# Patient Record
Sex: Female | Born: 1985 | Race: Black or African American | Hispanic: No | Marital: Married | Smoking: Never smoker
Health system: Southern US, Community
[De-identification: ages and names within clinical notes are randomized; demographics above are authoritative.]

## PROBLEM LIST (undated history)

## (undated) ENCOUNTER — Inpatient Hospital Stay (HOSPITAL_COMMUNITY): Payer: Self-pay

## (undated) DIAGNOSIS — L910 Hypertrophic scar: Secondary | ICD-10-CM

## (undated) DIAGNOSIS — R519 Headache, unspecified: Secondary | ICD-10-CM

## (undated) DIAGNOSIS — R51 Headache: Secondary | ICD-10-CM

## (undated) DIAGNOSIS — N883 Incompetence of cervix uteri: Secondary | ICD-10-CM

## (undated) HISTORY — PX: WISDOM TOOTH EXTRACTION: SHX21

---

## 2008-06-26 ENCOUNTER — Emergency Department (HOSPITAL_COMMUNITY): Admission: EM | Admit: 2008-06-26 | Discharge: 2008-06-26 | Payer: Self-pay | Admitting: Emergency Medicine

## 2013-02-16 ENCOUNTER — Ambulatory Visit (INDEPENDENT_AMBULATORY_CARE_PROVIDER_SITE_OTHER): Admitting: Family Medicine

## 2013-02-16 VITALS — BP 112/80 | HR 80 | Temp 98.2°F | Resp 16 | Ht 68.0 in | Wt 179.0 lb

## 2013-02-16 DIAGNOSIS — Z8049 Family history of malignant neoplasm of other genital organs: Secondary | ICD-10-CM

## 2013-02-16 DIAGNOSIS — M25559 Pain in unspecified hip: Secondary | ICD-10-CM

## 2013-02-16 DIAGNOSIS — N898 Other specified noninflammatory disorders of vagina: Secondary | ICD-10-CM

## 2013-02-16 DIAGNOSIS — N939 Abnormal uterine and vaginal bleeding, unspecified: Secondary | ICD-10-CM

## 2013-02-16 LAB — POCT UA - MICROSCOPIC ONLY
Bacteria, U Microscopic: NEGATIVE
Casts, Ur, LPF, POC: NEGATIVE
Crystals, Ur, HPF, POC: NEGATIVE
Mucus, UA: NEGATIVE
Yeast, UA: NEGATIVE

## 2013-02-16 LAB — POCT URINALYSIS DIPSTICK
Bilirubin, UA: NEGATIVE
Glucose, UA: NEGATIVE
Ketones, UA: NEGATIVE
Leukocytes, UA: NEGATIVE
Nitrite, UA: NEGATIVE
Protein, UA: NEGATIVE
Spec Grav, UA: 1.015
Urobilinogen, UA: 0.2
pH, UA: 7

## 2013-02-16 LAB — POCT CBC
Granulocyte percent: 43.5 % (ref 37–80)
HCT, POC: 42.1 % (ref 37.7–47.9)
Hemoglobin: 13.2 g/dL (ref 12.2–16.2)
Lymph, poc: 3.2 (ref 0.6–3.4)
MCH, POC: 27 pg (ref 27–31.2)
MCHC: 31.4 g/dL — AB (ref 31.8–35.4)
MCV: 56.2 fL — AB (ref 80–97)
MID (cbc): 0.9 (ref 0–0.9)
MPV: 12.2 fL (ref 0–99.8)
POC Granulocyte: 3.2 (ref 2–6.9)
POC LYMPH PERCENT: 43.8 % (ref 10–50)
POC MID %: 12.7 %M — AB (ref 0–12)
Platelet Count, POC: 199 10*3/uL (ref 142–424)
RBC: 4.88 M/uL (ref 4.04–5.48)
RDW, POC: 14.7 %
WBC: 7.4 10*3/uL (ref 4.6–10.2)

## 2013-02-16 NOTE — Progress Notes (Signed)
[facility]:  Office Visit  Chief Complaint:  Chief Complaint  Patient presents with  . Menstrual Problem    irregular bleeding, concerned due to family history of uterine growths, cervical cancer    HPI: Kelsey Willis is a 27 y.o. female who complains of irregular vaginal bleeding for less than 24 hours. Not heavy but feels like another period, no clots.  LMP 02/05/13. She has been on the same OCP for many years now and it has never caused her to have breakthrough bleeding. Been on her current birth control for years and takes them as rx Menarche age 39, periods last about 5 days, first 2 days heavy and then light, no clots,minimal cramping Does not have any h/o STDs, gets tested every drill period No other vaginal sxs ie  dc, itching, burning No prior abnormal paps, last pap in January. History of family ovarian cysts/cancer  and cervical cancer at around 27-28 for many females in her family --aunts, GM, mom Denies fevers,c hills, night seats, unintentional weightloss  History reviewed. No pertinent past medical history. History reviewed. No pertinent past surgical history. History   Social History  . Marital Status: Single    Spouse Name: N/A    Number of Children: N/A  . Years of Education: N/A   Social History Main Topics  . Smoking status: Never Smoker   . Smokeless tobacco: None  . Alcohol Use: No  . Drug Use: No  . Sexually Active: Yes    Birth Control/ Protection: Pill   Other Topics Concern  . None   Social History Narrative  . None   Family History  Problem Relation Age of Onset  . Cancer Mother   . Asthma Mother   . Heart disease Father   . Cancer Maternal Grandmother   . Cancer Paternal Grandmother   . Diabetes Paternal Grandfather   . Heart disease Paternal Grandfather    No Known Allergies Prior to Admission medications   Medication Sig Start Date End Date Taking? Authorizing Provider  lenalidomide (REVLIMID) 15 MG  capsule Take 15 mg by mouth daily.   Yes Historical Provider, MD     ROS: The patient denies fevers, chills, night sweats, unintentional weight loss, chest pain, palpitations, wheezing, dyspnea on exertion, nausea, vomiting, abdominal pain, dysuria, hematuria, melena, numbness, weakness, or tingling.   All other systems have been reviewed and were otherwise negative with the exception of those mentioned in the HPI and as above.    PHYSICAL EXAM: Filed Vitals:   02/16/13 1713  BP: 112/80  Pulse: 80  Temp: 98.2 F (36.8 C)  Resp: 16   Filed Vitals:   02/16/13 1713  Height: 5\' 8"  (1.727 m)  Weight: 179 lb (81.194 kg)   Body mass index is 27.22 kg/(m^2).  General: Alert, no acute distress HEENT:  Normocephalic, atraumatic, oropharynx patent.  Cardiovascular:  Regular rate and rhythm, no rubs murmurs or gallops.  No Carotid bruits, radial pulse intact. No pedal edema.  Respiratory: Clear to auscultation bilaterally.  No wheezes, rales, or rhonchi.  No cyanosis, no use of accessory musculature GI: No organomegaly, abdomen is soft and non-tender, positive bowel sounds.  No masses. Skin: No rashes. Neurologic: Facial musculature symmetric. Psychiatric: Patient is appropriate throughout our interaction. Lymphatic: No cervical lymphadenopathy Musculoskeletal: Gait intact. No inguinal LAD GU normal except for vaginal bleeding. No masses, lesions. No CMT,no rashes. No vaginal dc except for blood, no clots.  Mild tenderness pelvic right worse than  left   LABS: Results for orders placed in visit on 02/16/13  GC/CHLAMYDIA PROBE AMP      Result Value Range   CT Probe RNA NEGATIVE     GC Probe RNA NEGATIVE    POCT CBC      Result Value Range   WBC 7.4  4.6 - 10.2 K/uL   Lymph, poc 3.2  0.6 - 3.4   POC LYMPH PERCENT 43.8  10 - 50 %L   MID (cbc) 0.9  0 - 0.9   POC MID % 12.7 (*) 0 - 12 %M   POC Granulocyte 3.2  2 - 6.9   Granulocyte percent 43.5  37 - 80 %G   RBC 4.88  4.04 - 5.48  M/uL   Hemoglobin 13.2  12.2 - 16.2 g/dL   HCT, POC 16.1  09.6 - 47.9 %   MCV 56.2 (*) 80 - 97 fL   MCH, POC 27.0  27 - 31.2 pg   MCHC 31.4 (*) 31.8 - 35.4 g/dL   RDW, POC 04.5     Platelet Count, POC 199  142 - 424 K/uL   MPV 12.2  0 - 99.8 fL  POCT URINALYSIS DIPSTICK      Result Value Range   Color, UA yellow     Clarity, UA clear     Glucose, UA neg     Bilirubin, UA neg     Ketones, UA neg     Spec Grav, UA 1.015     Blood, UA moderate     pH, UA 7.0     Protein, UA neg     Urobilinogen, UA 0.2     Nitrite, UA neg     Leukocytes, UA Negative    POCT UA - MICROSCOPIC ONLY      Result Value Range   WBC, Ur, HPF, POC 1-5     RBC, urine, microscopic 0-2     Bacteria, U Microscopic neg     Mucus, UA neg     Epithelial cells, urine per micros 0-2     Crystals, Ur, HPF, POC neg     Casts, Ur, LPF, POC neg     Yeast, UA neg       EKG/XRAY:   Primary read interpreted by Dr. Conley Rolls at [facility].   ASSESSMENT/PLAN: Encounter Diagnoses  Name Primary?  . Vaginal bleeding Yes  . Pain in joint, pelvic region and thigh, unspecified laterality   . Family history of cervical cancer    Possibly related to break through bleeding from OCP. However, she is very anxious about family h.o ovarian/cervical cysts and cancer in her family. She wants further assurance that nothing is wrong. GC pending Needs transvaginal US Needs to return for just labwork for CBC since MCV was totally abnormal in 1-2 weeks.  F/u prn   LE, THAO PHUONG, DO 02/17/2013 2:49 PM

## 2013-02-17 LAB — GC/CHLAMYDIA PROBE AMP
CT Probe RNA: NEGATIVE
GC Probe RNA: NEGATIVE

## 2013-02-18 ENCOUNTER — Other Ambulatory Visit: Payer: Self-pay | Admitting: Radiology

## 2013-02-18 DIAGNOSIS — R102 Pelvic and perineal pain: Secondary | ICD-10-CM

## 2013-02-26 ENCOUNTER — Other Ambulatory Visit: Payer: Self-pay

## 2013-11-11 ENCOUNTER — Encounter: Payer: Self-pay | Admitting: Obstetrics & Gynecology

## 2013-11-11 ENCOUNTER — Ambulatory Visit (INDEPENDENT_AMBULATORY_CARE_PROVIDER_SITE_OTHER): Admitting: Obstetrics

## 2013-11-11 VITALS — BP 133/85 | Temp 97.8°F | Wt 192.0 lb

## 2013-11-11 DIAGNOSIS — Z3201 Encounter for pregnancy test, result positive: Secondary | ICD-10-CM

## 2013-11-11 DIAGNOSIS — Z3400 Encounter for supervision of normal first pregnancy, unspecified trimester: Secondary | ICD-10-CM

## 2013-11-11 LAB — POCT URINALYSIS DIPSTICK
Bilirubin, UA: NEGATIVE
KETONES UA: NEGATIVE
Nitrite, UA: NEGATIVE
PH UA: 6
RBC UA: NEGATIVE
UROBILINOGEN UA: NEGATIVE

## 2013-11-11 NOTE — Progress Notes (Signed)
Pulse: 76 Subjective:    Kelsey Willis is being seen today for her first obstetrical visit.  This is not a planned pregnancy. She is at 6167w5d gestation. Her obstetrical history is significant for None. Relationship with FOB: significant other, living together. Patient does intend to breast feed. Pregnancy history fully reviewed.  Menstrual History: OB History   Grav Para Term Preterm Abortions TAB SAB Ect Mult Living   1               Last Pap: 2013 Results were normal. Menarche age: [age]  Patient's last menstrual period was 10/02/2013.    The following portions of the patient's history were reviewed and updated as appropriate: allergies, current medications, past family history, past medical history, past social history, past surgical history and problem list.  Review of Systems Pertinent items are noted in HPI.    Objective:    General appearance: alert and no distress Abdomen: normal findings: soft, non-tender Pelvic: cervix normal in appearance, external genitalia normal, no adnexal masses or tenderness, no cervical motion tenderness, rectovaginal septum normal, uterus normal size, shape, and consistency and vagina normal without discharge Extremities: extremities normal, atraumatic, no cyanosis or edema    Assessment:    Pregnancy at 3667w5d weeks    Plan:    Initial labs drawn. Prenatal vitamins.  Counseling provided regarding continued use of seat belts, cessation of alcohol consumption, smoking or use of illicit drugs; infection precautions i.e., influenza/TDAP immunizations, toxoplasmosis,CMV, parvovirus, listeria and varicella; workplace safety, exercise during pregnancy; routine dental care, safe medications, sexual activity, hot tubs, saunas, pools, travel, caffeine use, fish and methlymercury, potential toxins, hair treatments, varicose veins Weight gain recommendations per IOM guidelines reviewed: underweight/BMI< 18.5--> gain 28 - 40 lbs; normal weight/BMI 18.5 -  24.9--> gain 25 - 35 lbs; overweight/BMI 25 - 29.9--> gain 15 - 25 lbs; obese/BMI >30->gain  11 - 20 lbs Problem list reviewed and updated. FIRST/CF mutation testing/NIPT/QUAD SCREEN discussed: requested. Role of ultrasound in pregnancy discussed; fetal survey: requested. Amniocentesis discussed: not indicated. VBAC calculator score: VBAC consent form provided Follow up in 4 weeks. 50% of 20 min visit spent on counseling and coordination of care.

## 2013-11-12 ENCOUNTER — Encounter: Payer: Self-pay | Admitting: Obstetrics

## 2013-11-12 LAB — OBSTETRIC PANEL
ANTIBODY SCREEN: NEGATIVE
BASOS ABS: 0 10*3/uL (ref 0.0–0.1)
Basophils Relative: 1 % (ref 0–1)
EOS ABS: 0.1 10*3/uL (ref 0.0–0.7)
EOS PCT: 2 % (ref 0–5)
HEMATOCRIT: 41.4 % (ref 36.0–46.0)
Hemoglobin: 13.6 g/dL (ref 12.0–15.0)
Hepatitis B Surface Ag: NEGATIVE
LYMPHS PCT: 39 % (ref 12–46)
Lymphs Abs: 2.5 10*3/uL (ref 0.7–4.0)
MCH: 27.5 pg (ref 26.0–34.0)
MCHC: 32.9 g/dL (ref 30.0–36.0)
MCV: 83.8 fL (ref 78.0–100.0)
MONO ABS: 0.5 10*3/uL (ref 0.1–1.0)
Monocytes Relative: 8 % (ref 3–12)
Neutro Abs: 3.4 10*3/uL (ref 1.7–7.7)
Neutrophils Relative %: 50 % (ref 43–77)
PLATELETS: 335 10*3/uL (ref 150–400)
RBC: 4.94 MIL/uL (ref 3.87–5.11)
RDW: 13.7 % (ref 11.5–15.5)
RUBELLA: 15.4 {index} — AB (ref <0.90)
Rh Type: POSITIVE
WBC: 6.6 10*3/uL (ref 4.0–10.5)

## 2013-11-12 LAB — HEMOGLOBINOPATHY EVALUATION
HGB A: 98 % — AB (ref 96.8–97.8)
HGB S QUANTITAION: 0 %
Hemoglobin Other: 0 %
Hgb A2 Quant: 2 % — ABNORMAL LOW (ref 2.2–3.2)
Hgb F Quant: 0 % (ref 0.0–2.0)

## 2013-11-12 LAB — PAP IG, CT-NG, RFX HPV ASCU
Chlamydia Probe Amp: NEGATIVE
GC PROBE AMP: NEGATIVE

## 2013-11-12 LAB — VARICELLA ZOSTER ANTIBODY, IGG: Varicella IgG: 707.6 Index — ABNORMAL HIGH (ref <135.00)

## 2013-11-12 LAB — WET PREP BY MOLECULAR PROBE
Candida species: NEGATIVE
Gardnerella vaginalis: POSITIVE — AB
TRICHOMONAS VAG: NEGATIVE

## 2013-11-12 LAB — GC/CHLAMYDIA PROBE AMP
CT PROBE, AMP APTIMA: NEGATIVE
GC Probe RNA: NEGATIVE

## 2013-11-12 LAB — CULTURE, OB URINE
Colony Count: NO GROWTH
Organism ID, Bacteria: NO GROWTH

## 2013-11-12 LAB — HIV ANTIBODY (ROUTINE TESTING W REFLEX): HIV: NONREACTIVE

## 2013-11-12 LAB — VITAMIN D 25 HYDROXY (VIT D DEFICIENCY, FRACTURES): Vit D, 25-Hydroxy: 28 ng/mL — ABNORMAL LOW (ref 30–89)

## 2013-12-08 ENCOUNTER — Other Ambulatory Visit: Payer: Self-pay | Admitting: *Deleted

## 2013-12-08 DIAGNOSIS — N760 Acute vaginitis: Principal | ICD-10-CM

## 2013-12-08 DIAGNOSIS — B9689 Other specified bacterial agents as the cause of diseases classified elsewhere: Secondary | ICD-10-CM

## 2013-12-08 MED ORDER — METRONIDAZOLE 500 MG PO TABS: 500.0000 mg | ORAL_TABLET | Freq: Two times a day (BID) | ORAL | Status: DC

## 2013-12-09 ENCOUNTER — Encounter: Admitting: Obstetrics

## 2013-12-17 ENCOUNTER — Ambulatory Visit (INDEPENDENT_AMBULATORY_CARE_PROVIDER_SITE_OTHER): Admitting: Obstetrics

## 2013-12-17 ENCOUNTER — Encounter: Payer: Self-pay | Admitting: Obstetrics

## 2013-12-17 VITALS — BP 132/83 | Temp 98.5°F | Wt 191.0 lb

## 2013-12-17 DIAGNOSIS — Z3400 Encounter for supervision of normal first pregnancy, unspecified trimester: Secondary | ICD-10-CM

## 2013-12-17 LAB — POCT URINALYSIS DIPSTICK
Bilirubin, UA: NEGATIVE
Blood, UA: NEGATIVE
Glucose, UA: NEGATIVE
KETONES UA: NEGATIVE
Leukocytes, UA: NEGATIVE
NITRITE UA: NEGATIVE
Protein, UA: NEGATIVE
Spec Grav, UA: 1.02
Urobilinogen, UA: NEGATIVE
pH, UA: 5

## 2013-12-17 NOTE — Progress Notes (Signed)
P 77 Patient would like to discuss her Quad screen results.

## 2014-01-21 ENCOUNTER — Encounter: Payer: Self-pay | Admitting: *Deleted

## 2014-01-21 ENCOUNTER — Ambulatory Visit (INDEPENDENT_AMBULATORY_CARE_PROVIDER_SITE_OTHER): Admitting: Obstetrics

## 2014-01-21 ENCOUNTER — Encounter: Payer: Self-pay | Admitting: Obstetrics

## 2014-01-21 VITALS — BP 134/81 | Temp 98.5°F | Wt 194.0 lb

## 2014-01-21 DIAGNOSIS — J309 Allergic rhinitis, unspecified: Secondary | ICD-10-CM

## 2014-01-21 DIAGNOSIS — Z3400 Encounter for supervision of normal first pregnancy, unspecified trimester: Secondary | ICD-10-CM

## 2014-01-21 DIAGNOSIS — J302 Other seasonal allergic rhinitis: Secondary | ICD-10-CM

## 2014-01-21 LAB — POCT URINALYSIS DIPSTICK
BILIRUBIN UA: NEGATIVE
Blood, UA: NEGATIVE
GLUCOSE UA: NEGATIVE
Ketones, UA: NEGATIVE
LEUKOCYTES UA: NEGATIVE
Nitrite, UA: NEGATIVE
PH UA: 8
Protein, UA: NEGATIVE
Spec Grav, UA: 1.01
Urobilinogen, UA: NEGATIVE

## 2014-01-21 MED ORDER — LORATADINE 10 MG PO TABS: 10.0000 mg | ORAL_TABLET | Freq: Every day | ORAL | Status: DC

## 2014-01-21 NOTE — Progress Notes (Signed)
Pulse 91 Pt states that she is having head cold symptoms.  Pt isn't sure if it is allergies or cold.  Pt states that she has been feeling this way for past month.  Pt has not tried OTC medication. Pt states that she has been having some lower pelvic cramps on left side for the past couple weeks.

## 2014-01-22 LAB — AFP, QUAD SCREEN
AFP: 61.7 IU/mL
Curr Gest Age: 15.6 wks.days
Down Syndrome Scr Risk Est: <1:38500 {titer}
HCG, Total: 26913 m[IU]/mL
INH: 142.6 pg/mL
Interpretation-AFP: NEGATIVE
MOM FOR AFP: 2.17
MOM FOR HCG: 1.18
MoM for INH: 0.84
Open Spina bifida: NEGATIVE
Osb Risk: 1:994 {titer}
TRI 18 SCR RISK EST: NEGATIVE
Trisomy 18 (Edward) Syndrome Interp.: 1:143000 {titer}
UE3 VALUE: 0.4 ng/mL
uE3 Mom: 1.04

## 2014-01-27 ENCOUNTER — Other Ambulatory Visit: Payer: Self-pay | Admitting: Obstetrics

## 2014-01-27 ENCOUNTER — Encounter: Payer: Self-pay | Admitting: Obstetrics

## 2014-01-27 ENCOUNTER — Ambulatory Visit (INDEPENDENT_AMBULATORY_CARE_PROVIDER_SITE_OTHER)

## 2014-01-27 DIAGNOSIS — Q6602 Congenital talipes equinovarus, left foot: Principal | ICD-10-CM

## 2014-01-27 DIAGNOSIS — Q6689 Other  specified congenital deformities of feet: Secondary | ICD-10-CM

## 2014-01-27 DIAGNOSIS — Z1389 Encounter for screening for other disorder: Secondary | ICD-10-CM

## 2014-01-27 DIAGNOSIS — Z3400 Encounter for supervision of normal first pregnancy, unspecified trimester: Secondary | ICD-10-CM

## 2014-01-27 DIAGNOSIS — Q6601 Congenital talipes equinovarus, right foot: Secondary | ICD-10-CM

## 2014-01-27 LAB — US OB DETAIL + 14 WK

## 2014-01-29 ENCOUNTER — Other Ambulatory Visit: Payer: Self-pay | Admitting: Obstetrics

## 2014-01-29 DIAGNOSIS — Z3689 Encounter for other specified antenatal screening: Secondary | ICD-10-CM

## 2014-01-29 DIAGNOSIS — O283 Abnormal ultrasonic finding on antenatal screening of mother: Secondary | ICD-10-CM

## 2014-02-04 ENCOUNTER — Other Ambulatory Visit: Payer: Self-pay | Admitting: Obstetrics

## 2014-02-04 ENCOUNTER — Ambulatory Visit (HOSPITAL_COMMUNITY)
Admission: RE | Admit: 2014-02-04 | Discharge: 2014-02-04 | Disposition: A | Discharge status: Home / Self Care | Source: Ambulatory Visit | Attending: Obstetrics | Admitting: Obstetrics

## 2014-02-04 VITALS — BP 139/90 | HR 95 | Wt 205.0 lb

## 2014-02-04 DIAGNOSIS — O358XX0 Maternal care for other (suspected) fetal abnormality and damage, not applicable or unspecified: Secondary | ICD-10-CM | POA: Insufficient documentation

## 2014-02-04 DIAGNOSIS — Z363 Encounter for antenatal screening for malformations: Secondary | ICD-10-CM | POA: Insufficient documentation

## 2014-02-04 DIAGNOSIS — O283 Abnormal ultrasonic finding on antenatal screening of mother: Secondary | ICD-10-CM

## 2014-02-04 DIAGNOSIS — Z3689 Encounter for other specified antenatal screening: Secondary | ICD-10-CM

## 2014-02-04 DIAGNOSIS — Z1389 Encounter for screening for other disorder: Secondary | ICD-10-CM | POA: Insufficient documentation

## 2014-02-18 ENCOUNTER — Ambulatory Visit (INDEPENDENT_AMBULATORY_CARE_PROVIDER_SITE_OTHER): Admitting: Obstetrics

## 2014-02-18 VITALS — BP 120/75 | HR 80 | Temp 97.2°F | Wt 203.0 lb

## 2014-02-18 DIAGNOSIS — Z3400 Encounter for supervision of normal first pregnancy, unspecified trimester: Secondary | ICD-10-CM

## 2014-02-18 LAB — POCT URINALYSIS DIPSTICK
Bilirubin, UA: NEGATIVE
GLUCOSE UA: NEGATIVE
Ketones, UA: NEGATIVE
Leukocytes, UA: NEGATIVE
Nitrite, UA: NEGATIVE
RBC UA: NEGATIVE
Spec Grav, UA: <=1.005
UROBILINOGEN UA: NEGATIVE
pH, UA: 8

## 2014-02-19 ENCOUNTER — Encounter: Payer: Self-pay | Admitting: Obstetrics

## 2014-02-19 NOTE — Progress Notes (Signed)
Subjective:    Kelsey LevyChristina L Willis is a 28 y.o. female being seen today for her obstetrical visit. She is at 7915w0d gestation. Patient reports: no complaints . Fetal movement: normal.  Past Medical History  Diagnosis Date  . Medical history non-contributory     Past Surgical History  Procedure Laterality Date  . Wisdom tooth extraction      Current outpatient prescriptions:loratadine (CLARITIN) 10 MG tablet, Take 1 tablet (10 mg total) by mouth daily., Disp: 30 tablet, Rfl: 11;  prenatal vitamin w/FE, FA (PRENATAL 1 + 1) 27-1 MG TABS tablet, Take 1 tablet by mouth daily at 12 noon., Disp: , Rfl:  No Known Allergies  History  Substance Use Topics  . Smoking status: Never Smoker   . Smokeless tobacco: Never Used  . Alcohol Use: No    Family History  Problem Relation Age of Onset  . Cancer Mother   . Asthma Mother   . Heart disease Father   . Cancer Maternal Grandmother   . Cancer Paternal Grandmother   . Diabetes Paternal Grandfather   . Heart disease Paternal Grandfather      Review of Systems Constitutional: negative for anorexia Gastrointestinal: negative for abdominal pain Genitourinary:negative for vaginal discharge Musculoskeletal:negative for back pain Behavioral/Psych: negative for depression and tobacco use  Objective:    BP 120/75  Pulse 80  Temp(Src) 97.2 F (36.2 C)  Wt 203 lb (92.08 kg)  LMP 10/02/2013 FHT: 150 BPM  Uterine Size: size equals dates     Assessment:    Pregnancy @ 9515w0d    Plan:    OBGCT: ordered for next visit.  Labs, problem list reviewed and updated 2 hr GTT planned Follow up in 4 weeks.

## 2014-03-04 ENCOUNTER — Other Ambulatory Visit (HOSPITAL_COMMUNITY): Payer: Self-pay | Admitting: Maternal and Fetal Medicine

## 2014-03-04 ENCOUNTER — Ambulatory Visit (HOSPITAL_COMMUNITY)
Admission: RE | Admit: 2014-03-04 | Discharge: 2014-03-04 | Disposition: A | Discharge status: Home / Self Care | Source: Ambulatory Visit | Attending: Obstetrics | Admitting: Obstetrics

## 2014-03-04 DIAGNOSIS — O283 Abnormal ultrasonic finding on antenatal screening of mother: Secondary | ICD-10-CM

## 2014-03-04 DIAGNOSIS — O358XX0 Maternal care for other (suspected) fetal abnormality and damage, not applicable or unspecified: Secondary | ICD-10-CM | POA: Insufficient documentation

## 2014-03-04 DIAGNOSIS — O289 Unspecified abnormal findings on antenatal screening of mother: Secondary | ICD-10-CM | POA: Insufficient documentation

## 2014-03-04 DIAGNOSIS — Z3689 Encounter for other specified antenatal screening: Secondary | ICD-10-CM | POA: Insufficient documentation

## 2014-03-04 IMAGING — US US MFM OB TRANSVAGINAL
1 series · 12 of 28 positions shown · non-contrast
Comparison: none

[Series 1: us mfm ob transvaginal · 0.22mm/px · 12 of 55 slices shown]
[im 3/55]
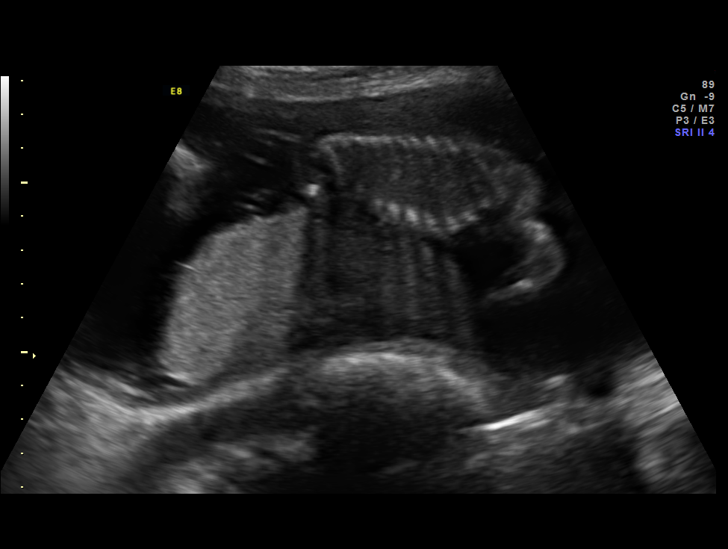
[im 7/55]
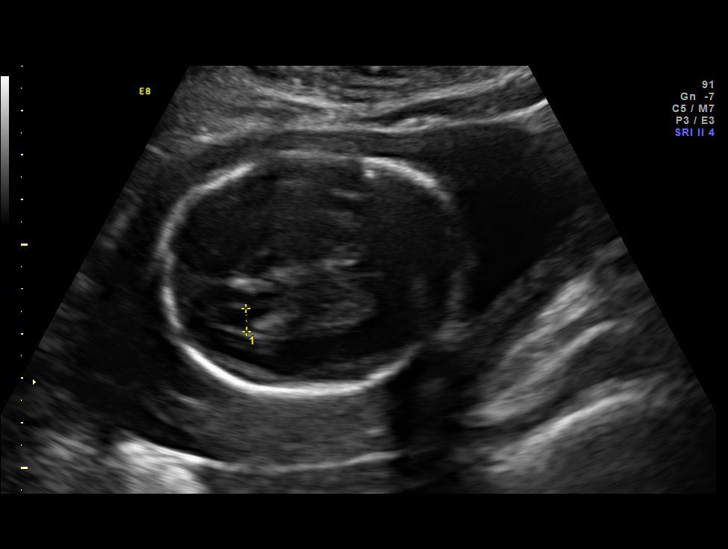
[im 11/55]
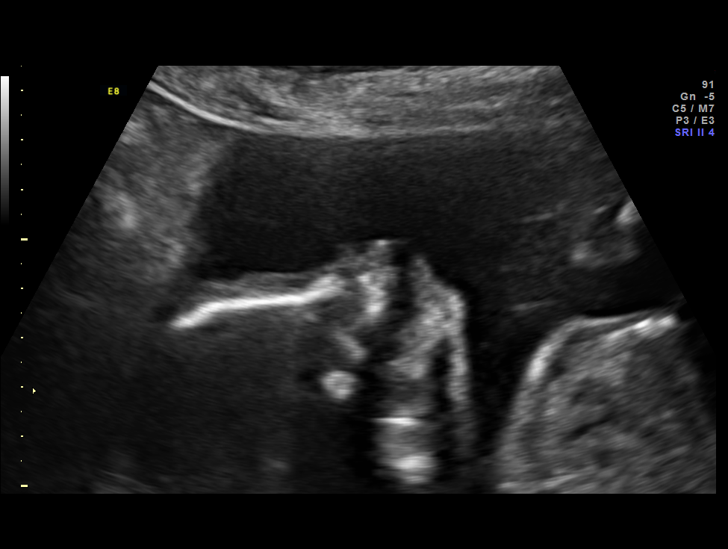
[im 17/55]
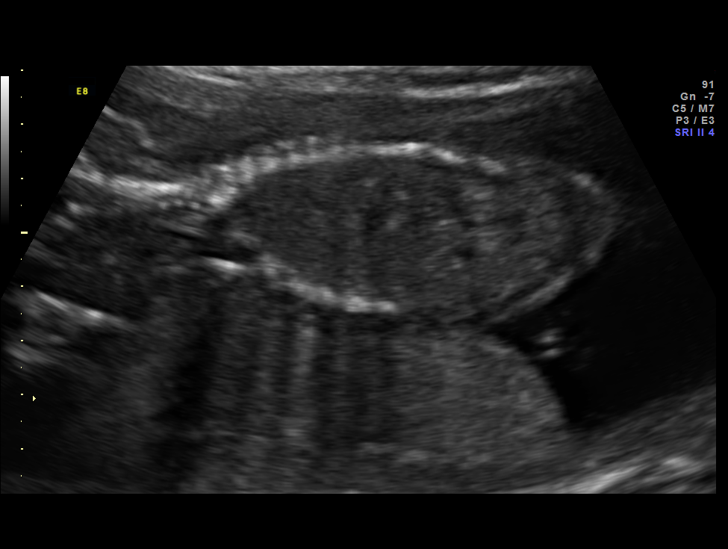
[im 21/55]
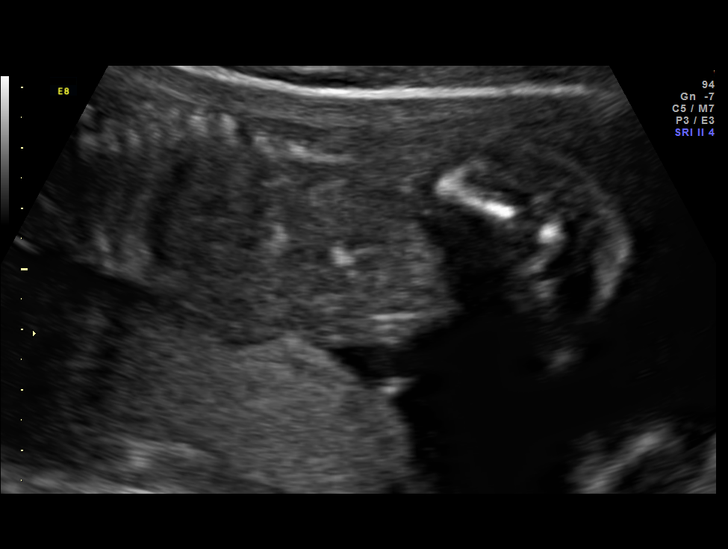
[im 25/55]
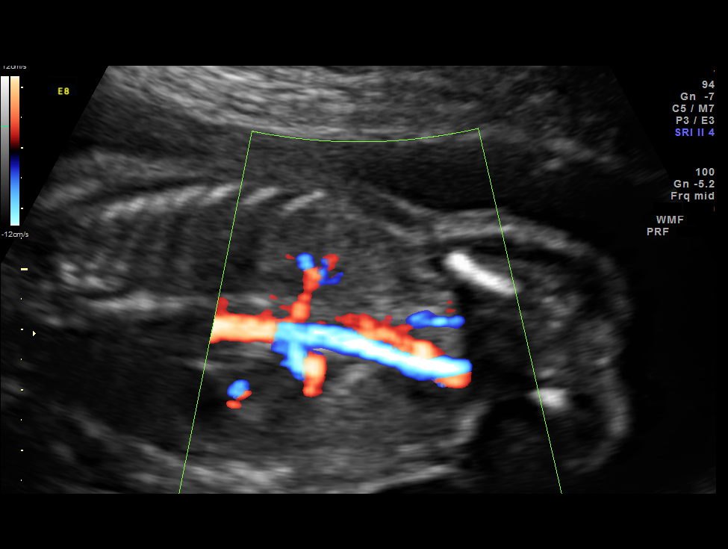
[im 31/55]
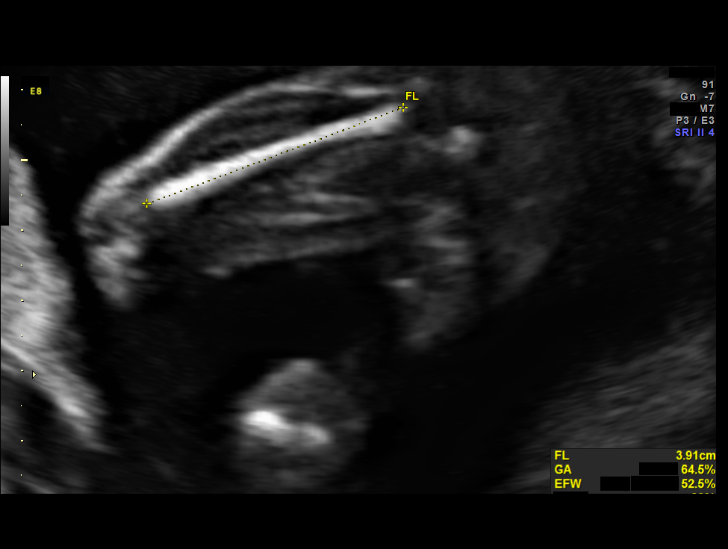
[im 35/55]
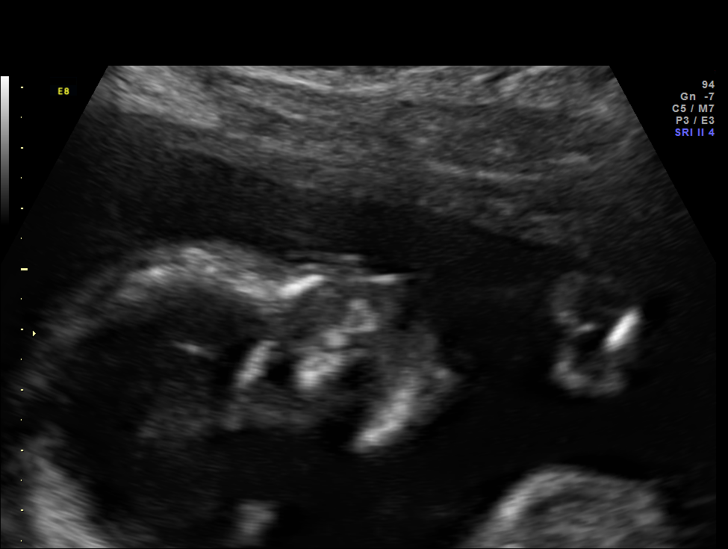
[im 39/55]
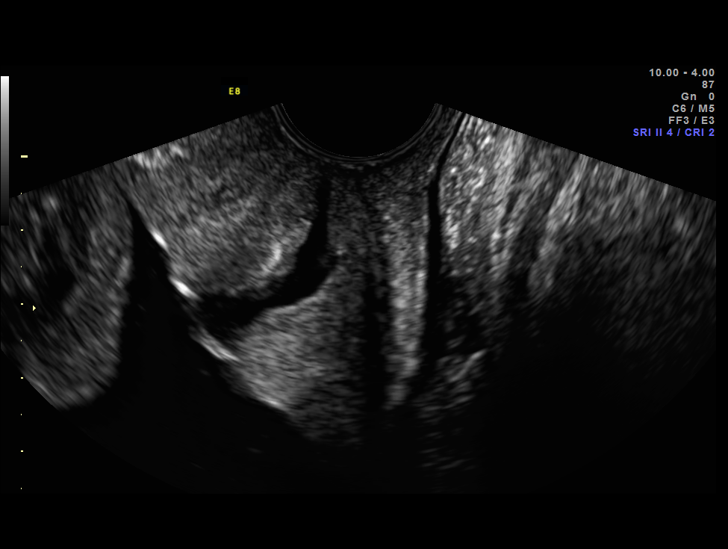
[im 45/55]
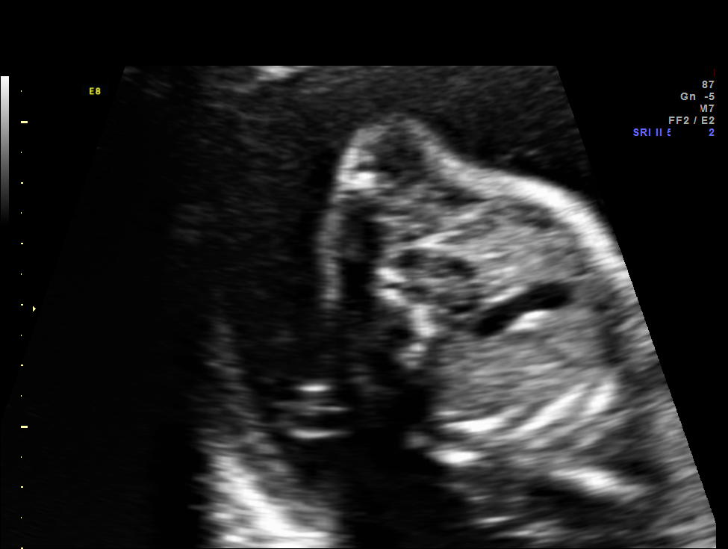
[im 49/55]
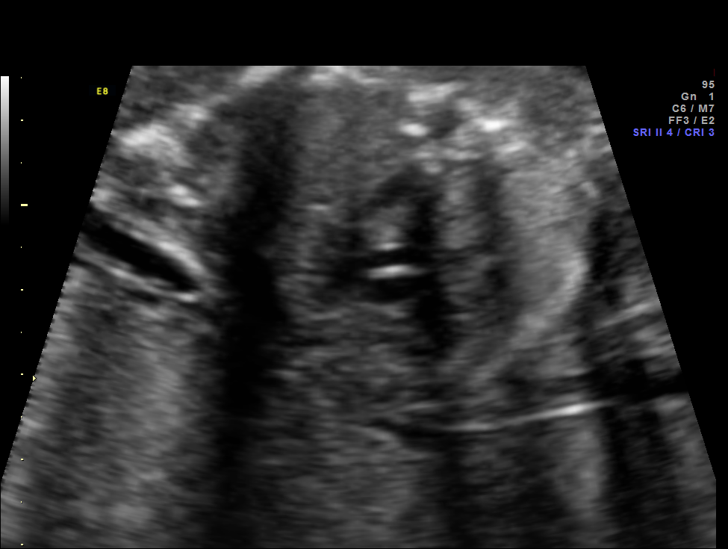
[im 53/55]
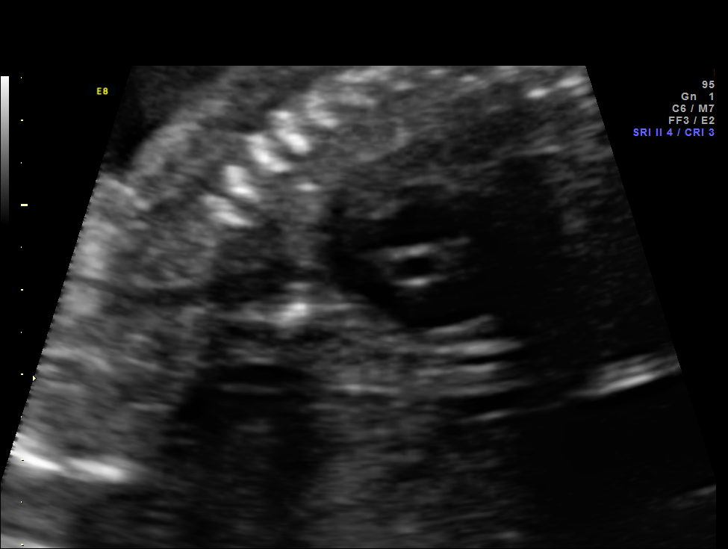

[12 of 28 positions shown; findings below may reference images not displayed]

OBSTETRICS REPORT
                      (Signed Final [DATE] [DATE])

Service(s) Provided

 US OB FOLLOW UP                                       76816.1
 US MFM OB TRANSVAGINAL                                76817.2
Indications

 Fetal abnormality - clubbed feet
Fetal Evaluation

 Num Of Fetuses:    1
 Fetal Heart Rate:  136                          bpm
 Cardiac Activity:  Observed
 Presentation:      Breech
 Placenta:          Posterior, above cervical
                    os
 P. Cord            Previously Visualized
 Insertion:

 Amniotic Fluid
 AFI FV:      Subjectively within normal limits
                                             Larg Pckt:     5.8  cm
Biometry

 BPD:     50.9  mm     G. Age:  21w 3d                CI:         72.4   70 - 86
 OFD:     70.3  mm                                    FL/HC:      20.0   18.4 -

 HC:     198.3  mm     G. Age:  22w 0d       45  %    HC/AC:      1.15   1.06 -

 AC:     172.7  mm     G. Age:  22w 1d       54  %    FL/BPD:     77.8   71 - 87
 FL:      39.6  mm     G. Age:  22w 5d       70  %    FL/AC:      22.9   20 - 24
 HUM:     36.3  mm     G. Age:  22w 5d       68  %

 Est. FW:     498  gm      1 lb 2 oz     55  %
Gestational Age

 LMP:           21w 6d        Date:  [DATE]                 EDD:   [DATE]
 U/S Today:     22w 0d                                        EDD:   [DATE]
 Best:          21w 6d     Det. By:  LMP  ([DATE])          EDD:   [DATE]
Anatomy

 Cranium:          Previously seen        Aortic Arch:      Appears normal
 Fetal Cavum:      Previously seen        Ductal Arch:      Previously seen
 Ventricles:       Appears normal         Diaphragm:        Appears normal
 Choroid Plexus:   Previously seen        Stomach:          Appears normal, left
                                                            sided
 Cerebellum:       Previously seen        Abdomen:          Appears normal
 Posterior Fossa:  Previously seen        Abdominal Wall:   Appears nml (cord
                                                            insert, abd wall)
 Nuchal Fold:      Previously seen        Cord Vessels:     Previously seen
 Face:             Orbits and profile     Kidneys:          Appear normal
                   previously seen
 Lips:             Previously seen        Bladder:          Appears normal
 Heart:            Previously seen        Spine:            Previously seen
 RVOT:             Appears normal         Lower             Clubfeet
                                          Extremities:
 LVOT:             Not well visualized    Upper             Previously seen
                                          Extremities:

 Other:  Male gender.
Cervix Uterus Adnexa

 Cervical Length:    3.3      cm

 Cervix:       Measured transvaginally.
Impression

 SIUP at 21+6 weeks
 Bilateral club feet (difficult to visualize today due to fetal
 position
 All other interval fetal anatomy was seen and appeared
 normal; limited views of LVOT
 Normal amniotic fluid volume
 Appropriate interval growth with EFW at the 55th %tile
 EV views of cervix: normal length (hypoechoic fluid/mucous
 fills the endocervical canal - similar in appearance to the
 images obtained during the first US)
 Amnion separation again seen

 We reviewed her quad screen - very low risks for T21, T18
 and ONTDs. I offered her a fetal ECHO and she accepted.
Recommendations

 Follow-up ultrasound for growth in 6 weeks
 Fetal ECHO scheduled

## 2014-03-08 ENCOUNTER — Other Ambulatory Visit: Payer: Self-pay | Admitting: Obstetrics

## 2014-03-08 DIAGNOSIS — O358XX0 Maternal care for other (suspected) fetal abnormality and damage, not applicable or unspecified: Secondary | ICD-10-CM

## 2014-03-18 ENCOUNTER — Encounter: Admitting: Obstetrics

## 2014-04-06 ENCOUNTER — Telehealth: Payer: Self-pay | Admitting: *Deleted

## 2014-04-06 NOTE — Telephone Encounter (Signed)
Patient is requesting a Rx for a breast pump to be faxed to the following facility. Patient delivered preterm out of town. 5:32 Rx faxed for breast pump. Patient notified.

## 2014-04-15 ENCOUNTER — Ambulatory Visit (HOSPITAL_COMMUNITY): Admission: RE | Admit: 2014-04-15 | Source: Ambulatory Visit

## 2014-07-26 ENCOUNTER — Telehealth: Payer: Self-pay | Admitting: *Deleted

## 2014-07-26 NOTE — Telephone Encounter (Signed)
Patient called and stated that she has a hard mass under her C section incision that is getting larger. 5:43 LM on VM to CB- she may come to the office on Wed @ 1:00

## 2014-08-30 ENCOUNTER — Encounter: Payer: Self-pay | Admitting: Obstetrics

## 2014-09-16 ENCOUNTER — Encounter (HOSPITAL_COMMUNITY): Payer: Self-pay | Admitting: *Deleted

## 2014-10-25 ENCOUNTER — Encounter: Payer: Self-pay | Admitting: *Deleted

## 2014-12-28 ENCOUNTER — Encounter: Payer: Self-pay | Admitting: Certified Nurse Midwife

## 2014-12-28 ENCOUNTER — Ambulatory Visit (INDEPENDENT_AMBULATORY_CARE_PROVIDER_SITE_OTHER): Admitting: Certified Nurse Midwife

## 2014-12-28 VITALS — BP 125/83 | HR 69 | Temp 97.7°F | Ht 67.0 in | Wt 213.0 lb

## 2014-12-28 DIAGNOSIS — Z01419 Encounter for gynecological examination (general) (routine) without abnormal findings: Secondary | ICD-10-CM

## 2014-12-28 DIAGNOSIS — Z30018 Encounter for initial prescription of other contraceptives: Secondary | ICD-10-CM

## 2014-12-28 LAB — RPR

## 2014-12-28 MED ORDER — ETONOGESTREL-ETHINYL ESTRADIOL 0.12-0.015 MG/24HR VA RING: 1.0000 | VAGINAL_RING | VAGINAL | Status: DC

## 2014-12-28 NOTE — Progress Notes (Signed)
Patient ID: Kelsey LevyChristina L Cortina, female   DOB: [birth year], 29 y.o.   MRN: [id number]    Subjective:     Kelsey Willis is a 29 y.o. female here for a routine exam.  Current complaints: desires annual exam and contraception.  In a stable relationship, about to get married.  [facility] [employer], had a premature infant at 24 weeks, doing well.  Fiance is about to Graduate, they are building a home.  Lots of stressors, denies depression/anxiety.      Personal health questionnaire:  Is patient Ashkenazi Jewish, have a family history of breast and/or ovarian cancer: no Is there a family history of uterine cancer diagnosed at age < 7550, gastrointestinal cancer, urinary tract cancer, family member who is a Personnel officerLynch syndrome-associated carrier: no Is the patient overweight and hypertensive, family history of diabetes, personal history of gestational diabetes, preeclampsia or PCOS: no Is patient over 5855, have PCOS,  family history of premature CHD under age 29, diabetes, smoke, have hypertension or peripheral artery disease:  no At any time, has a partner hit, kicked or otherwise hurt or frightened you?: no Over the past 2 weeks, have you felt down, depressed or hopeless?: no Over the past 2 weeks, have you felt little interest or pleasure in doing things?:no   Gynecologic History Patient's last menstrual period was 11/30/2014. Contraception: OCP (estrogen/progesterone) Last Pap: 11/11/2013. Results were: normal Last mammogram: none.   Obstetric History OB History  Gravida Para Term Preterm AB SAB TAB Ectopic Multiple Living  1             # Outcome Date GA Lbr Len/2nd Weight Sex Delivery Anes PTL Lv  1 Gravida               Past Medical History  Diagnosis Date  . Medical history non-contributory     Past Surgical History  Procedure Laterality Date  . Wisdom tooth extraction    . Cesarean section       Current outpatient prescriptions:  .  loratadine (CLARITIN) 10 MG tablet, Take 1  tablet (10 mg total) by mouth daily., Disp: 30 tablet, Rfl: 11 .  prenatal vitamin w/FE, FA (PRENATAL 1 + 1) 27-1 MG TABS tablet, Take 1 tablet by mouth daily at 12 noon., Disp: , Rfl:  .  etonogestrel-ethinyl estradiol (NUVARING) 0.12-0.015 MG/24HR vaginal ring, Place 1 each vaginally every 21 ( twenty-one) days. Insert one (1) ring vaginally and leave in place for three (3) weeks, then remove for one (1) week., Disp: 1 each, Rfl: 11 .  loratadine (CLARITIN) 5 MG/5ML syrup, Take by mouth., Disp: , Rfl:  No Known Allergies  History  Substance Use Topics  . Smoking status: Never Smoker   . Smokeless tobacco: Never Used  . Alcohol Use: No    Family History  Problem Relation Age of Onset  . Cancer Mother   . Asthma Mother   . Heart disease Father   . Cancer Maternal Grandmother   . Cancer Paternal Grandmother   . Diabetes Paternal Grandfather   . Heart disease Paternal Grandfather       Review of Systems  Constitutional: negative for fatigue and weight loss Respiratory: negative for cough and wheezing Cardiovascular: negative for chest pain, fatigue and palpitations Gastrointestinal: negative for abdominal pain and change in bowel habits Musculoskeletal:negative for myalgias Neurological: negative for gait problems and tremors Behavioral/Psych: negative for abusive relationship, depression Endocrine: negative for temperature intolerance   Genitourinary:negative for abnormal menstrual periods, genital  lesions, hot flashes, sexual problems and vaginal discharge Integument/breast: negative for breast lump, breast tenderness, nipple discharge and skin lesion(s)    Objective:       BP 125/83 mmHg  Pulse 69  Temp(Src) 97.7 F (36.5 C)  Ht  (1.702 m)  Wt 96.616 kg (213 lb)  BMI 33.35 kg/m2  LMP 11/30/2014  Breastfeeding? No General:   alert  Skin:   no rash or abnormalities  Lungs:   clear to auscultation bilaterally  Heart:   regular rate and rhythm, S1, S2 normal, no  murmur, click, rub or gallop  Breasts:   normal without suspicious masses, skin or nipple changes or axillary nodes  Abdomen:  normal findings: no organomegaly, soft, non-tender and no hernia  Pelvis:  External genitalia: normal general appearance Urinary system: urethral meatus normal and bladder without fullness, nontender Vaginal: normal without tenderness, induration or masses Cervix: normal appearance Adnexa: normal bimanual exam Uterus: anteverted and non-tender, normal size   Lab Review Urine pregnancy test Labs reviewed yes Radiologic studies reviewed no   Assessment:    Healthy female exam.  Contraception Counseling.    Plan:    Education reviewed: low fat, low cholesterol diet, safe sex/STD prevention, self breast exams and weight bearing exercise. Contraception: NuvaRing vaginal inserts. Follow up in: 1 year.   Meds ordered this encounter  Medications  . DISCONTD: etonogestrel-ethinyl estradiol (NUVARING) 0.12-0.015 MG/24HR vaginal ring    Sig: Place 1 each vaginally every 21 ( twenty-one) days. Insert one (1) ring vaginally and leave in place for three (3) weeks, then remove for one (1) week.    Dispense:  1 each    Refill:  11  . loratadine (CLARITIN) 5 MG/5ML syrup    Sig: Take by mouth.  . etonogestrel-ethinyl estradiol (NUVARING) 0.12-0.015 MG/24HR vaginal ring    Sig: Place 1 each vaginally every 21 ( twenty-one) days. Insert one (1) ring vaginally and leave in place for three (3) weeks, then remove for one (1) week.    Dispense:  1 each    Refill:  11   Orders Placed This Encounter  Procedures  . SureSwab, Vaginosis/Vaginitis Plus  . HIV antibody (with reflex)  . Hepatitis B surface antigen  . RPR  . Hepatitis C antibody   Possible management options include: Mirena IUD/Nexplanon

## 2014-12-29 LAB — PAP IG W/ RFLX HPV ASCU

## 2014-12-29 LAB — HIV ANTIBODY (ROUTINE TESTING W REFLEX): HIV: NONREACTIVE

## 2014-12-29 LAB — HEPATITIS C ANTIBODY: HCV Ab: NEGATIVE

## 2014-12-29 LAB — HEPATITIS B SURFACE ANTIGEN: Hepatitis B Surface Ag: NEGATIVE

## 2014-12-31 LAB — SURESWAB, VAGINOSIS/VAGINITIS PLUS
Atopobium vaginae: NOT DETECTED Log (cells/mL)
C. GLABRATA, DNA: DETECTED — AB
C. PARAPSILOSIS, DNA: NOT DETECTED
C. TROPICALIS, DNA: NOT DETECTED
C. albicans, DNA: NOT DETECTED
C. trachomatis RNA, TMA: NOT DETECTED
GARDNERELLA VAGINALIS: 7.9 Log (cells/mL)
LACTOBACILLUS SPECIES: NOT DETECTED Log (cells/mL)
MEGASPHAERA SPECIES: NOT DETECTED Log (cells/mL)
N. gonorrhoeae RNA, TMA: NOT DETECTED
T. VAGINALIS RNA, QL TMA: NOT DETECTED

## 2015-01-14 ENCOUNTER — Encounter: Payer: Self-pay | Admitting: *Deleted

## 2015-01-14 ENCOUNTER — Other Ambulatory Visit: Payer: Self-pay | Admitting: *Deleted

## 2015-01-14 DIAGNOSIS — B373 Candidiasis of vulva and vagina: Secondary | ICD-10-CM

## 2015-01-14 DIAGNOSIS — B3731 Acute candidiasis of vulva and vagina: Secondary | ICD-10-CM

## 2015-01-14 DIAGNOSIS — N760 Acute vaginitis: Principal | ICD-10-CM

## 2015-01-14 DIAGNOSIS — B9689 Other specified bacterial agents as the cause of diseases classified elsewhere: Secondary | ICD-10-CM

## 2015-01-14 MED ORDER — ITRACONAZOLE 200 MG PO TABS: 200.0000 | ORAL_TABLET | Freq: Every day | ORAL | Status: DC

## 2015-01-14 MED ORDER — METRONIDAZOLE 500 MG PO TABS: 500.0000 mg | ORAL_TABLET | Freq: Two times a day (BID) | ORAL | Status: DC

## 2015-01-18 ENCOUNTER — Telehealth: Payer: Self-pay | Admitting: *Deleted

## 2015-01-18 NOTE — Telephone Encounter (Signed)
Pharmacy called to office for verification on Rx of Itraconazole.  States 200 tablets by mouth daily.  Return call to pharmacy,  Rx correction for 200mg  by mouth daily.

## 2015-02-28 ENCOUNTER — Encounter (HOSPITAL_COMMUNITY): Payer: Self-pay | Admitting: Emergency Medicine

## 2015-02-28 ENCOUNTER — Emergency Department (HOSPITAL_COMMUNITY)
Admission: EM | Admit: 2015-02-28 | Discharge: 2015-02-28 | Disposition: A | Discharge status: Home / Self Care | Attending: Emergency Medicine | Admitting: Emergency Medicine

## 2015-02-28 DIAGNOSIS — Z792 Long term (current) use of antibiotics: Secondary | ICD-10-CM | POA: Diagnosis not present

## 2015-02-28 DIAGNOSIS — Z79899 Other long term (current) drug therapy: Secondary | ICD-10-CM | POA: Diagnosis not present

## 2015-02-28 DIAGNOSIS — R21 Rash and other nonspecific skin eruption: Secondary | ICD-10-CM | POA: Insufficient documentation

## 2015-02-28 MED ORDER — FAMOTIDINE 20 MG PO TABS: 20.0000 mg | ORAL_TABLET | Freq: Two times a day (BID) | ORAL | Status: DC

## 2015-02-28 MED ORDER — PREDNISONE 20 MG PO TABS
60.0000 mg | ORAL_TABLET | Freq: Once | ORAL | Status: AC
  Administered 2015-02-28: 60 mg via ORAL
  Filled 2015-02-28: qty 3

## 2015-02-28 MED ORDER — PREDNISONE 10 MG PO TABS: 40.0000 mg | ORAL_TABLET | Freq: Every day | ORAL | Status: DC

## 2015-02-28 MED ORDER — FAMOTIDINE 20 MG PO TABS
20.0000 mg | ORAL_TABLET | Freq: Once | ORAL | Status: AC
  Administered 2015-02-28: 20 mg via ORAL
  Filled 2015-02-28: qty 1

## 2015-02-28 NOTE — ED Notes (Signed)
Pt presents to ED with diffuse rash all over her body from the neck down.  It is visibly red and has raised bumps that are extremely itchy. She states that it is sore but she thinks that is due to constant rubbing and scratching.  She has taken Benadryl (5 doses in the last two days) and Cortisone 10 cream with no relief of the itching.  She denies any recent changes in soap, clothing, food, or outdoor habits.

## 2015-02-28 NOTE — ED Provider Notes (Signed)
CSN: [id number]     Arrival date & time 02/28/15  2249 History   This chart was scribed for Earley Favor, NP working with April Palumbo, MD by Evon Slack, ED Scribe. This patient was seen in room WTR6/WTR6 and the patient's care was started at 11:09 PM.    Chief Complaint  Patient presents with  . Rash   Patient is a 29 y.o. female presenting with rash. The history is provided by the patient. No language interpreter was used.  Rash Location:  Full body Quality: itchiness   Severity:  Moderate Onset quality:  Gradual Duration:  2 days Timing:  Constant Progression:  Worsening Chronicity:  New Relieved by:  Nothing Worsened by:  Heat Ineffective treatments:  Anti-itch cream Associated symptoms: no fever and no shortness of breath    HPI Comments: Kelsey Willis is a 29 y.o. female with PMHx of eczema who presents to the Emergency Department complaining of new worsening itchy diffuse rash from the neck down for 2 days.  Pt states that she has tried benadryl and cortisone cream with no relief. Pt states that the rash is so itchy she is scratching until she bleeds. Pt denies any new soaps or detergents. Pt denies fevers or other related symptoms.   Past Medical History  Diagnosis Date  . Medical history non-contributory    Past Surgical History  Procedure Laterality Date  . Wisdom tooth extraction    . Cesarean section     Family History  Problem Relation Age of Onset  . Cancer Mother   . Asthma Mother   . Heart disease Father   . Cancer Maternal Grandmother   . Cancer Paternal Grandmother   . Diabetes Paternal Grandfather   . Heart disease Paternal Grandfather    History  Substance Use Topics  . Smoking status: Never Smoker   . Smokeless tobacco: Never Used  . Alcohol Use: No   OB History    Gravida Para Term Preterm AB TAB SAB Ectopic Multiple Living   1               Review of Systems  Constitutional: Negative for fever.  Respiratory: Negative for  shortness of breath.   Skin: Positive for rash.  All other systems reviewed and are negative.   Allergies  Review of patient's allergies indicates no known allergies.  Home Medications   Prior to Admission medications   Medication Sig Start Date End Date Taking? Authorizing Provider  etonogestrel-ethinyl estradiol (NUVARING) 0.12-0.015 MG/24HR vaginal ring Place 1 each vaginally every 21 ( twenty-one) days. Insert one (1) ring vaginally and leave in place for three (3) weeks, then remove for one (1) week. 12/28/14 12/20/15  Brock Bad, MD  famotidine (PEPCID) 20 MG tablet Take 1 tablet (20 mg total) by mouth 2 (two) times daily. 02/28/15   Earley Favor, NP  Itraconazole 200 MG TABS Take 200 tablets by mouth daily. 01/14/15   Brock Bad, MD  loratadine (CLARITIN) 10 MG tablet Take 1 tablet (10 mg total) by mouth daily. 01/21/14   Brock Bad, MD  loratadine (CLARITIN) 5 MG/5ML syrup Take by mouth.    Historical Provider, MD  metroNIDAZOLE (FLAGYL) 500 MG tablet Take 1 tablet (500 mg total) by mouth 2 (two) times daily. 01/14/15   Brock Bad, MD  predniSONE (DELTASONE) 10 MG tablet Take 4 tablets (40 mg total) by mouth daily with breakfast. 02/28/15   Earley Favor, NP  prenatal vitamin w/FE, FA (PRENATAL  1 + 1) 27-1 MG TABS tablet Take 1 tablet by mouth daily at 12 noon.    Historical Provider, MD   BP 126/82 mmHg  Pulse 62  Temp(Src) 98.7 F (37.1 C) (Oral)  Resp 16  Ht 5\' 7"  (1.702 m)  Wt 210 lb (95.255 kg)  BMI 32.88 kg/m2  SpO2 100%  LMP 12/30/2014 (Exact Date)   Physical Exam  Constitutional: She is oriented to person, place, and time. She appears well-developed and well-nourished. No distress.  HENT:  Head: Normocephalic and atraumatic.  Eyes: Conjunctivae and EOM are normal.  Neck: Neck supple. No tracheal deviation present.  Cardiovascular: Normal rate.   Pulmonary/Chest: Effort normal. No respiratory distress.  Musculoskeletal: Normal range of motion.   Neurological: She is alert and oriented to person, place, and time.  Skin: Skin is warm and dry. Rash noted.  Psychiatric: She has a normal mood and affect. Her behavior is normal.  Nursing note and vitals reviewed.   ED Course  Procedures (including critical care time) DIAGNOSTIC STUDIES: Oxygen Saturation is 100% on RA, normal by my interpretation.    COORDINATION OF CARE: 11:16 PM-Discussed treatment plan with pt at bedside and pt agreed to plan.     Labs Review Labs Reviewed - No data to display  Imaging Review No results found.   EKG Interpretation None     will start a 5 day prednisone taper as well as Pepcid twice a day patient has been instructed to go back to basics ivory soap or Neutrogena soap every one to discharge and she is to rinse off her clothing in warm water and gradually add normal soaps etc. back and when it is timex  MDM   Final diagnoses:  Rash      I personally performed the services described in this documentation, which was scribed in my presence. The recorded information has been reviewed and is accurate.     Earley FavorGail Ireoluwa Grant, NP 02/28/15 2325  April Palumbo, MD 03/01/15 [id number]

## 2015-02-28 NOTE — Discharge Instructions (Signed)
Contact Dermatitis °Contact dermatitis is a reaction to certain substances that touch the skin. Contact dermatitis can be either irritant contact dermatitis or allergic contact dermatitis. Irritant contact dermatitis does not require previous exposure to the substance for a reaction to occur. Allergic contact dermatitis only occurs if you have been exposed to the substance before. Upon a repeat exposure, your body reacts to the substance.  °CAUSES  °Many substances can cause contact dermatitis. Irritant dermatitis is most commonly caused by repeated exposure to mildly irritating substances, such as: °· Makeup. °· Soaps. °· Detergents. °· Bleaches. °· Acids. °· Metal salts, such as nickel. °Allergic contact dermatitis is most commonly caused by exposure to: °· Poisonous plants. °· Chemicals (deodorants, shampoos). °· Jewelry. °· Latex. °· Neomycin in triple antibiotic cream. °· Preservatives in products, including clothing. °SYMPTOMS  °The area of skin that is exposed may develop: °· Dryness or flaking. °· Redness. °· Cracks. °· Itching. °· Pain or a burning sensation. °· Blisters. °With allergic contact dermatitis, there may also be swelling in areas such as the eyelids, mouth, or genitals.  °DIAGNOSIS  °Your caregiver can usually tell what the problem is by doing a physical exam. In cases where the cause is uncertain and an allergic contact dermatitis is suspected, a patch skin test may be performed to help determine the cause of your dermatitis. °TREATMENT °Treatment includes protecting the skin from further contact with the irritating substance by avoiding that substance if possible. Barrier creams, powders, and gloves may be helpful. Your caregiver may also recommend: °· Steroid creams or ointments applied 2 times daily. For best results, soak the rash area in cool water for 20 minutes. Then apply the medicine. Cover the area with a plastic wrap. You can store the steroid cream in the refrigerator for a "chilly"  effect on your rash. That may decrease itching. Oral steroid medicines may be needed in more severe cases. °· Antibiotics or antibacterial ointments if a skin infection is present. °· Antihistamine lotion or an antihistamine taken by mouth to ease itching. °· Lubricants to keep moisture in your skin. °· Burow's solution to reduce redness and soreness or to dry a weeping rash. Mix one packet or tablet of solution in 2 cups cool water. Dip a clean washcloth in the mixture, wring it out a bit, and put it on the affected area. Leave the cloth in place for 30 minutes. Do this as often as possible throughout the day. °· Taking several cornstarch or baking soda baths daily if the area is too large to cover with a washcloth. °Harsh chemicals, such as alkalis or acids, can cause skin damage that is like a burn. You should flush your skin for 15 to 20 minutes with cold water after such an exposure. You should also seek immediate medical care after exposure. Bandages (dressings), antibiotics, and pain medicine may be needed for severely irritated skin.  °HOME CARE INSTRUCTIONS °· Avoid the substance that caused your reaction. °· Keep the area of skin that is affected away from hot water, soap, sunlight, chemicals, acidic substances, or anything else that would irritate your skin. °· Do not scratch the rash. Scratching may cause the rash to become infected. °· You may take cool baths to help stop the itching. °· Only take over-the-counter or prescription medicines as directed by your caregiver. °· See your caregiver for follow-up care as directed to make sure your skin is healing properly. °SEEK MEDICAL CARE IF:  °· Your condition is not better after 3   days of treatment.  You seem to be getting worse.  You see signs of infection such as swelling, tenderness, redness, soreness, or warmth in the affected area.  You have any problems related to your medicines. Document Released: 10/12/2000 Document Revised: 01/07/2012  Document Reviewed: 03/20/2011 [facility] Patient Information 2015 [employer] [place]. This information is not intended to replace advice given to you by your health care provider. Make sure you discuss any questions you have with your health care provider. As discussed please eliminate all medics soaps etc. watch or rinse your clothing and warm water just to decrease whatever is coming in contact with skin use Ivory soap or Neutrogena add cosmetics/sap etc  back one at a time

## 2015-03-10 ENCOUNTER — Encounter (HOSPITAL_COMMUNITY): Payer: Self-pay | Admitting: Emergency Medicine

## 2015-03-10 ENCOUNTER — Emergency Department (HOSPITAL_COMMUNITY)
Admission: EM | Admit: 2015-03-10 | Discharge: 2015-03-10 | Disposition: A | Discharge status: Home / Self Care | Attending: Emergency Medicine | Admitting: Emergency Medicine

## 2015-03-10 DIAGNOSIS — Z7952 Long term (current) use of systemic steroids: Secondary | ICD-10-CM | POA: Insufficient documentation

## 2015-03-10 DIAGNOSIS — R21 Rash and other nonspecific skin eruption: Secondary | ICD-10-CM | POA: Diagnosis not present

## 2015-03-10 DIAGNOSIS — Z79899 Other long term (current) drug therapy: Secondary | ICD-10-CM | POA: Diagnosis not present

## 2015-03-10 DIAGNOSIS — Z792 Long term (current) use of antibiotics: Secondary | ICD-10-CM | POA: Insufficient documentation

## 2015-03-10 DIAGNOSIS — Z793 Long term (current) use of hormonal contraceptives: Secondary | ICD-10-CM | POA: Insufficient documentation

## 2015-03-10 LAB — RAPID STREP SCREEN (MED CTR MEBANE ONLY): STREPTOCOCCUS, GROUP A SCREEN (DIRECT): NEGATIVE

## 2015-03-10 MED ORDER — PREDNISONE 20 MG PO TABS
60.0000 mg | ORAL_TABLET | Freq: Once | ORAL | Status: AC
  Administered 2015-03-10: 60 mg via ORAL
  Filled 2015-03-10: qty 3

## 2015-03-10 MED ORDER — PREDNISONE 50 MG PO TABS: 50.0000 mg | ORAL_TABLET | Freq: Every day | ORAL | Status: DC

## 2015-03-10 NOTE — ED Provider Notes (Signed)
CSN: [id number]     Arrival date & time 03/10/15  [id number] History   First MD Initiated Contact with Patient 03/10/15 0535     Chief Complaint  Patient presents with  . Allergic Reaction     (Consider location/radiation/quality/duration/timing/severity/associated sxs/prior Treatment) Patient is a 29 y.o. female presenting with allergic reaction. The history is provided by the patient.  Allergic Reaction She comes in with a rash on her arms and legs which has been present for about 2 weeks. It is very pruritic. She states that the rash consists of little bumps which sometimes break and have some clear fluid drained. She denies fever or chills, sore throat, cough, vomiting or diarrhea. She denies any new exposures. She had been seen in emergency and had been prescribed prednisone which gave some slight, temporary improvement. She's been taking loratadine and famotidine without any relief. She denies difficulty breathing or swallowing.  Past Medical History  Diagnosis Date  . Medical history non-contributory    Past Surgical History  Procedure Laterality Date  . Wisdom tooth extraction    . Cesarean section     Family History  Problem Relation Age of Onset  . Cancer Mother   . Asthma Mother   . Heart disease Father   . Cancer Maternal Grandmother   . Cancer Paternal Grandmother   . Diabetes Paternal Grandfather   . Heart disease Paternal Grandfather    History  Substance Use Topics  . Smoking status: Never Smoker   . Smokeless tobacco: Never Used  . Alcohol Use: No   OB History    Gravida Para Term Preterm AB TAB SAB Ectopic Multiple Living   1              Review of Systems  All other systems reviewed and are negative.     Allergies  Review of patient's allergies indicates no known allergies.  Home Medications   Prior to Admission medications   Medication Sig Start Date End Date Taking? Authorizing Provider  etonogestrel-ethinyl estradiol (NUVARING) 0.12-0.015  MG/24HR vaginal ring Place 1 each vaginally every 21 ( twenty-one) days. Insert one (1) ring vaginally and leave in place for three (3) weeks, then remove for one (1) week. 12/28/14 12/20/15  Brock Badharles A Harper, MD  famotidine (PEPCID) 20 MG tablet Take 1 tablet (20 mg total) by mouth 2 (two) times daily. 02/28/15   Earley FavorGail Schulz, NP  Itraconazole 200 MG TABS Take 200 tablets by mouth daily. 01/14/15   Brock Badharles A Harper, MD  loratadine (CLARITIN) 10 MG tablet Take 1 tablet (10 mg total) by mouth daily. 01/21/14   Brock Badharles A Harper, MD  loratadine (CLARITIN) 5 MG/5ML syrup Take by mouth.    Historical Provider, MD  metroNIDAZOLE (FLAGYL) 500 MG tablet Take 1 tablet (500 mg total) by mouth 2 (two) times daily. 01/14/15   Brock Badharles A Harper, MD  predniSONE (DELTASONE) 10 MG tablet Take 4 tablets (40 mg total) by mouth daily with breakfast. 02/28/15   Earley FavorGail Schulz, NP  prenatal vitamin w/FE, FA (PRENATAL 1 + 1) 27-1 MG TABS tablet Take 1 tablet by mouth daily at 12 noon.    Historical Provider, MD   BP 133/81 mmHg  Pulse 79  Temp(Src) 98.1 F (36.7 C) (Oral)  Resp 18  Ht 5\' 7"  (1.702 m)  Wt 208 lb (94.348 kg)  BMI 32.57 kg/m2  SpO2 99%  LMP 01/30/2015 Physical Exam  Nursing note and vitals reviewed.  29 year old female, resting comfortably and in  no acute distress. Vital signs are normal. Oxygen saturation is 99%, which is normal. Head is normocephalic and atraumatic. PERRLA, EOMI. Oropharynx is  mildly erythematous Neck is nontender and supple without adenopathy or JVD. Back is nontender and there is no CVA tenderness. Lungs are clear without rales, wheezes, or rhonchi. Chest is nontender. Heart has regular rate and rhythm without murmur. Abdomen is soft, flat, nontender without masses or hepatosplenomegaly and peristalsis is normoactive. Extremities have no cyanosis or edema, full range of motion is present. Skin is warm and dry. There is a papular rash present on the arms and legs. Neurologic: Mental  status is normal, cranial nerves are intact, there are no motor or sensory deficits.  ED Course  Procedures (including critical care time) Labs Review Results for orders placed or performed during the hospital encounter of 03/10/15  Rapid strep screen  Result Value Ref Range   Streptococcus, Group A Screen (Direct) NEGATIVE NEGATIVE     MDM   Final diagnoses:  Papular rash   Rash of uncertain cause. Strep screen will be obtained. If negative, I will 1 refer her to dermatology for further evaluation. Review of old records confirms ED visit 10 days ago with prescription for prednisone for 2 days.  Strep screen is negative. She is discharged with prescription for prednisone and referral to dermatology.  Dione Boozeavid Marcio Hoque, MD 03/10/15 [phone]

## 2015-03-10 NOTE — Discharge Instructions (Signed)
The cause for your rash is not clear. Please make an appointment with a dermatologist for further evaluation. Continue taking loratadine and famotidine.  Prednisone tablets What is this medicine? PREDNISONE (PRED ni sone) is a corticosteroid. It is commonly used to treat inflammation of the skin, joints, lungs, and other organs. Common conditions treated include asthma, allergies, and arthritis. It is also used for other conditions, such as blood disorders and diseases of the adrenal glands. This medicine may be used for other purposes; ask your health care provider or pharmacist if you have questions. COMMON BRAND NAME(S): Deltasone, Predone, Sterapred, Sterapred DS What should I tell my health care provider before I take this medicine? They need to know if you have any of these conditions: -Cushing's syndrome -diabetes -glaucoma -heart disease -high blood pressure -infection (especially a virus infection such as chickenpox, cold sores, or herpes) -kidney disease -liver disease -mental illness -myasthenia gravis -osteoporosis -seizures -stomach or intestine problems -thyroid disease -an unusual or allergic reaction to lactose, prednisone, other medicines, foods, dyes, or preservatives -pregnant or trying to get pregnant -breast-feeding How should I use this medicine? Take this medicine by mouth with a glass of water. Follow the directions on the prescription label. Take this medicine with food. If you are taking this medicine once a day, take it in the morning. Do not take more medicine than you are told to take. Do not suddenly stop taking your medicine because you may develop a severe reaction. Your doctor will tell you how much medicine to take. If your doctor wants you to stop the medicine, the dose may be slowly lowered over time to avoid any side effects. Talk to your pediatrician regarding the use of this medicine in children. Special care may be needed. Overdosage: If you think  you have taken too much of this medicine contact a poison control center or emergency room at once. NOTE: This medicine is only for you. Do not share this medicine with others. What if I miss a dose? If you miss a dose, take it as soon as you can. If it is almost time for your next dose, talk to your doctor or health care professional. You may need to miss a dose or take an extra dose. Do not take double or extra doses without advice. What may interact with this medicine? Do not take this medicine with any of the following medications: -metyrapone -mifepristone This medicine may also interact with the following medications: -aminoglutethimide -amphotericin B -aspirin and aspirin-like medicines -barbiturates -certain medicines for diabetes, like glipizide or glyburide -cholestyramine -cholinesterase inhibitors -cyclosporine -digoxin -diuretics -ephedrine -female hormones, like estrogens and birth control pills -isoniazid -ketoconazole -NSAIDS, medicines for pain and inflammation, like ibuprofen or naproxen -phenytoin -rifampin -toxoids -vaccines -warfarin This list may not describe all possible interactions. Give your health care provider a list of all the medicines, herbs, non-prescription drugs, or dietary supplements you use. Also tell them if you smoke, drink alcohol, or use illegal drugs. Some items may interact with your medicine. What should I watch for while using this medicine? Visit your doctor or health care professional for regular checks on your progress. If you are taking this medicine over a prolonged period, carry an identification card with your name and address, the type and dose of your medicine, and your doctor's name and address. This medicine may increase your risk of getting an infection. Tell your doctor or health care professional if you are around anyone with measles or chickenpox, or if you  develop sores or blisters that do not heal properly. If you are  going to have surgery, tell your doctor or health care professional that you have taken this medicine within the last twelve months. Ask your doctor or health care professional about your diet. You may need to lower the amount of salt you eat. This medicine may affect blood sugar levels. If you have diabetes, check with your doctor or health care professional before you change your diet or the dose of your diabetic medicine. What side effects may I notice from receiving this medicine? Side effects that you should report to your doctor or health care professional as soon as possible: -allergic reactions like skin rash, itching or hives, swelling of the face, lips, or tongue -changes in emotions or moods -changes in vision -depressed mood -eye pain -fever or chills, cough, sore throat, pain or difficulty passing urine -increased thirst -swelling of ankles, feet Side effects that usually do not require medical attention (report to your doctor or health care professional if they continue or are bothersome): -confusion, excitement, restlessness -headache -nausea, vomiting -skin problems, acne, thin and shiny skin -trouble sleeping -weight gain This list may not describe all possible side effects. Call your doctor for medical advice about side effects. You may report side effects to FDA at 1-800-FDA-1088. Where should I keep my medicine? Keep out of the reach of children. Store at room temperature between 15 and 30 degrees C (59 and 86 degrees F). Protect from light. Keep container tightly closed. Throw away any unused medicine after the expiration date. NOTE: This sheet is a summary. It may not cover all possible information. If you have questions about this medicine, talk to your doctor, pharmacist, or health care provider.  2015, [employer]. (2011-05-31 10:57:14)

## 2015-03-10 NOTE — ED Notes (Signed)
Pt states that she was here 2 weeks ago for allergic reaction and was prescribed hydrocortisone cream and benadryl to use if it happened again.   2 days ago she used both for the first time and she has noticed small bumps only on her arms.   The bumps have persisted since that time and itch worse at night per pt.   Airway intact, no tongue swelling, no sob.  VSS, no acute distress noted.  Pt denies changing laundry detergent, shampoo, or any thing else of that nature.

## 2015-03-10 NOTE — ED Notes (Signed)
MD at bedside. 

## 2015-03-12 LAB — CULTURE, GROUP A STREP: STREP A CULTURE: NEGATIVE

## 2015-03-15 ENCOUNTER — Telehealth: Payer: Self-pay | Admitting: *Deleted

## 2015-03-15 NOTE — Telephone Encounter (Signed)
Patient states she is currently using Nuvaring for contraception. Patient states she has not had a period in 2 months. Patient states she is using the Nuvaring as instructed. Patient states she has had negative pregnancy tests.  Patient advised that as long as she is using it correctly it is okay. Patient advised to contact the office is she has anymore concerns.

## 2015-04-11 ENCOUNTER — Encounter: Payer: Self-pay | Admitting: Podiatry

## 2015-04-11 ENCOUNTER — Ambulatory Visit (INDEPENDENT_AMBULATORY_CARE_PROVIDER_SITE_OTHER): Admitting: Podiatry

## 2015-04-11 ENCOUNTER — Ambulatory Visit

## 2015-04-11 VITALS — BP 142/86 | HR 69 | Resp 12

## 2015-04-11 DIAGNOSIS — M722 Plantar fascial fibromatosis: Secondary | ICD-10-CM

## 2015-04-11 MED ORDER — MELOXICAM 15 MG PO TABS: 15.0000 mg | ORAL_TABLET | Freq: Every day | ORAL | Status: DC

## 2015-04-11 NOTE — Progress Notes (Signed)
Subjective:     Patient ID: Kelsey Willis, female   DOB: 1986/02/24, 29 y.o.   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      Objective:   Physical Exam  Constitutional: She is oriented to person, place, and time.  Cardiovascular: Intact distal pulses.   Musculoskeletal: Normal range of motion.  Neurological: She is oriented to person, place, and time.  Skin: Skin is warm.  Nursing note and vitals reviewed.  neurovascular status intact with muscle strength adequate range of motion within normal limits and patient noted to have quite a bit of depression of the arch bilateral. Patient has discomfort of a moderate nature in the plantar heels arch area bilateral with no area with increased fluid     Assessment:     Inflammatory fasciitis bilateral with foot structure is a big contributor to condition    Plan:     H&P and x-rays reviewed with patient. Today I dispensed fascial brace bilateral with instructions on usage and scanned for custom orthotics to reduce all plantar pain in the feet. Placed on molded 15 mg daily and discussed possibility for cortisone injection in future depending on response to conservative care

## 2015-04-11 NOTE — Patient Instructions (Signed)
Plantar Fasciitis  Plantar fasciitis is a common condition that causes foot pain. It is soreness (inflammation) of the band of tough fibrous tissue on the bottom of the foot that runs from the heel bone (calcaneus) to the ball of the foot. The cause of this soreness may be from excessive standing, poor fitting shoes, running on hard surfaces, being overweight, having an abnormal walk, or overuse (this is common in runners) of the painful foot or feet. It is also common in aerobic exercise dancers and ballet dancers.  SYMPTOMS   Most people with plantar fasciitis complain of:   Severe pain in the morning on the bottom of their foot especially when taking the first steps out of bed. This pain recedes after a few minutes of walking.   Severe pain is experienced also during walking following a long period of inactivity.   Pain is worse when walking barefoot or up stairs  DIAGNOSIS    Your caregiver will diagnose this condition by examining and feeling your foot.   Special tests such as X-rays of your foot, are usually not needed.  PREVENTION    Consult a sports medicine professional before beginning a new exercise program.   Walking programs offer a good workout. With walking there is a lower chance of overuse injuries common to runners. There is less impact and less jarring of the joints.   Begin all new exercise programs slowly. If problems or pain develop, decrease the amount of time or distance until you are at a comfortable level.   Wear good shoes and replace them regularly.   Stretch your foot and the heel cords at the back of the ankle (Achilles tendon) both before and after exercise.   Run or exercise on even surfaces that are not hard. For example, asphalt is better than pavement.   Do not run barefoot on hard surfaces.   If using a treadmill, vary the incline.   Do not continue to workout if you have foot or joint problems. Seek professional help if they do not improve.  HOME CARE INSTRUCTIONS     Avoid activities that cause you pain until you recover.   Use ice or cold packs on the problem or painful areas after working out.   Only take over-the-counter or prescription medicines for pain, discomfort, or fever as directed by your caregiver.   Soft shoe inserts or athletic shoes with air or gel sole cushions may be helpful.   If problems continue or become more severe, consult a sports medicine caregiver or your own health care provider. Cortisone is a potent anti-inflammatory medication that may be injected into the painful area. You can discuss this treatment with your caregiver.  MAKE SURE YOU:    Understand these instructions.   Will watch your condition.   Will get help right away if you are not doing well or get worse.  Document Released: 07/10/2001 Document Revised: 01/07/2012 Document Reviewed: 09/08/2008  ExitCare Patient Information 2015 [employer]. This information is not intended to replace advice given to you by your health care provider. Make sure you discuss any questions you have with your health care provider.

## 2015-04-11 NOTE — Progress Notes (Signed)
   Subjective:    Patient ID: Kelsey Willis, female    DOB: 1986/08/20, 29 y.o.   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 FEET ARE GETTING WORSE ESPECIALLY WHEN WALKING. TRIED OTC INSERTS BUT NO HELP.  ALSO, NEED ORTHOTICS  Review of Systems  All other systems reviewed and are negative.      Objective:   Physical Exam        Assessment & Plan:

## 2015-04-14 ENCOUNTER — Emergency Department (HOSPITAL_COMMUNITY)
Admission: EM | Admit: 2015-04-14 | Discharge: 2015-04-14 | Disposition: A | Discharge status: Home / Self Care | Attending: Emergency Medicine | Admitting: Emergency Medicine

## 2015-04-14 ENCOUNTER — Encounter (HOSPITAL_COMMUNITY): Payer: Self-pay

## 2015-04-14 DIAGNOSIS — Z791 Long term (current) use of non-steroidal anti-inflammatories (NSAID): Secondary | ICD-10-CM | POA: Insufficient documentation

## 2015-04-14 DIAGNOSIS — R21 Rash and other nonspecific skin eruption: Secondary | ICD-10-CM | POA: Insufficient documentation

## 2015-04-14 DIAGNOSIS — Z7951 Long term (current) use of inhaled steroids: Secondary | ICD-10-CM | POA: Diagnosis not present

## 2015-04-14 DIAGNOSIS — R590 Localized enlarged lymph nodes: Secondary | ICD-10-CM | POA: Diagnosis not present

## 2015-04-14 DIAGNOSIS — Z79899 Other long term (current) drug therapy: Secondary | ICD-10-CM | POA: Insufficient documentation

## 2015-04-14 MED ORDER — FAMOTIDINE 20 MG PO TABS: 20.0000 mg | ORAL_TABLET | Freq: Two times a day (BID) | ORAL | Status: DC

## 2015-04-14 MED ORDER — PREDNISONE 20 MG PO TABS
60.0000 mg | ORAL_TABLET | Freq: Once | ORAL | Status: AC
  Administered 2015-04-14: 60 mg via ORAL
  Filled 2015-04-14: qty 3

## 2015-04-14 MED ORDER — ZITHROMAX 500 MG PO TABS: 500.0000 mg | ORAL_TABLET | Freq: Every day | ORAL | Status: DC

## 2015-04-14 MED ORDER — DIPHENHYDRAMINE HCL 25 MG PO CAPS
25.0000 mg | ORAL_CAPSULE | Freq: Once | ORAL | Status: AC
  Administered 2015-04-14: 25 mg via ORAL
  Filled 2015-04-14: qty 1

## 2015-04-14 MED ORDER — FAMOTIDINE 20 MG PO TABS
20.0000 mg | ORAL_TABLET | Freq: Once | ORAL | Status: AC
  Administered 2015-04-14: 20 mg via ORAL
  Filled 2015-04-14: qty 1

## 2015-04-14 MED ORDER — PREDNISONE 20 MG PO TABS: 60.0000 mg | ORAL_TABLET | Freq: Every day | ORAL | Status: DC

## 2015-04-14 NOTE — ED Notes (Addendum)
Patient complains of rash over entire body. She reports she was seen and began treatment for strep throat on Tuesday, 04/12/15.  She started taking PCN VK 500mg  that evening and has since had three doses.  She used Allegra and Cortisone 10 at home with no relief.  Denies dyspnea, wheezing.

## 2015-04-14 NOTE — ED Provider Notes (Signed)
CSN: [id number]     Arrival date & time 04/14/15  0522 History   First MD Initiated Contact with Patient 04/14/15 0720     Chief Complaint  Patient presents with  . Allergic Reaction     (Consider location/radiation/quality/duration/timing/severity/associated sxs/prior Treatment) HPI Comments: Patient presents today with a rash.  She states that the rash is located on both arms and both legs.  She noticed the rash 12 hours prior to arrival.  She states that the rash is gradually worsening.  Rash is pruritic.  She took Allegra and applied Cortisone 10 cream to the rash without improvement.  She states that she was started on PCN two days ago to treat Strep throat.  She reports that she was diagnosed with Strep Throat via rapid strep test.   She denies fever, chills, nausea, vomiting, abdominal pain, SOB, wheezing, dizziness, or syncope.  She denies swelling of the lips, tongue, or throat.  The history is provided by the patient.    Past Medical History  Diagnosis Date  . Medical history non-contributory    Past Surgical History  Procedure Laterality Date  . Wisdom tooth extraction    . Cesarean section     Family History  Problem Relation Age of Onset  . Cancer Mother   . Asthma Mother   . Heart disease Father   . Cancer Maternal Grandmother   . Cancer Paternal Grandmother   . Diabetes Paternal Grandfather   . Heart disease Paternal Grandfather    History  Substance Use Topics  . Smoking status: Never Smoker   . Smokeless tobacco: Never Used  . Alcohol Use: No   OB History    Gravida Para Term Preterm AB TAB SAB Ectopic Multiple Living   1              Review of Systems  Constitutional: Negative for fever and chills.  Gastrointestinal: Negative for nausea, vomiting and abdominal pain.  Skin: Positive for rash.  All other systems reviewed and are negative.     Allergies  Review of patient's allergies indicates no known allergies.  Home Medications   Prior to  Admission medications   Medication Sig Start Date End Date Taking? Authorizing Provider  etonogestrel-ethinyl estradiol (NUVARING) 0.12-0.015 MG/24HR vaginal ring Place 1 each vaginally every 21 ( twenty-one) days. Insert one (1) ring vaginally and leave in place for three (3) weeks, then remove for one (1) week. 12/28/14 12/20/15  Brock Bad, MD  famotidine (PEPCID) 20 MG tablet Take 1 tablet (20 mg total) by mouth 2 (two) times daily. 02/28/15   Earley Favor, NP  Itraconazole 200 MG TABS Take 200 tablets by mouth daily. 01/14/15   Brock Bad, MD  loratadine (CLARITIN) 10 MG tablet Take 1 tablet (10 mg total) by mouth daily. 01/21/14   Brock Bad, MD  loratadine (CLARITIN) 5 MG/5ML syrup Take by mouth.    Historical Provider, MD  meloxicam (MOBIC) 15 MG tablet Take 1 tablet (15 mg total) by mouth daily. 04/11/15   Lenn Sink, DPM  metroNIDAZOLE (FLAGYL) 500 MG tablet Take 1 tablet (500 mg total) by mouth 2 (two) times daily. 01/14/15   Brock Bad, MD  predniSONE (DELTASONE) 50 MG tablet Take 1 tablet (50 mg total) by mouth daily. 03/10/15   Dione Booze, MD  prenatal vitamin w/FE, FA (PRENATAL 1 + 1) 27-1 MG TABS tablet Take 1 tablet by mouth daily at 12 noon.    Historical Provider, MD  BP 128/80 mmHg  Pulse 80  Temp(Src) 97.9 F (36.6 C) (Oral)  Resp 18  Ht  (1.702 m)  Wt 204 lb (92.534 kg)  BMI 31.94 kg/m2  SpO2 100%  LMP 03/12/2015 (Approximate) Physical Exam  Constitutional: She appears well-developed and well-nourished.  HENT:  Head: Normocephalic and atraumatic.  Mouth/Throat: No trismus in the jaw. No uvula swelling. Oropharyngeal exudate and posterior oropharyngeal erythema present.  Airway widely patent No swelling of the lips, tongue, or throat Patient handling secretions well, no drooling Normal voice phonation  Neck: Normal range of motion. Neck supple.  Cardiovascular: Normal rate, regular rhythm and normal heart sounds.   Pulmonary/Chest: Effort  normal and breath sounds normal. No respiratory distress. She has no wheezes. She has no rales.  Abdominal: Soft. There is no tenderness.  Musculoskeletal: Normal range of motion.  Lymphadenopathy:    She has cervical adenopathy.  Neurological: She is alert.  Skin: Skin is warm and dry. Rash noted.  Erythematous papular rash located on both arms and both legs  Psychiatric: She has a normal mood and affect.  Nursing note and vitals reviewed.   ED Course  Procedures (including critical care time) Labs Review Labs Reviewed - No data to display  Imaging Review No results found.   EKG Interpretation None      MDM   Final diagnoses:  None   Patient presents today with a diffuse erythematous papular rash.  She noticed the rash the day after starting PCN to treat a strep throat.  VSS.  No signs of anaphylaxis.  Patient given Benadryl, Pepcid, and Prednisone.  Patient instructed to stop her PCN and given different antibiotic to treat Strep.  Patient stable for discharge.  Return precautions given.    Santiago Glad, PA-C 04/15/15 1453  Santiago Glad, PA-C 04/15/15 1453  Mancel Bale, MD 04/16/15 1531

## 2015-05-06 ENCOUNTER — Ambulatory Visit: Admitting: *Deleted

## 2015-05-06 DIAGNOSIS — M722 Plantar fascial fibromatosis: Secondary | ICD-10-CM

## 2015-05-06 NOTE — Patient Instructions (Signed)

## 2015-05-06 NOTE — Progress Notes (Signed)
Patient ID: Fidel LevyChristina L Willis, female   DOB: 08/20/1986, 29 y.o.   MRN: [id number] Patient presents for orthotic fitting.  Verbal and written break in and wear instructions given.  Patient will follow up in 4 weeks if symptoms worsen or fail to improve.

## 2015-10-11 ENCOUNTER — Other Ambulatory Visit: Payer: Self-pay | Admitting: *Deleted

## 2015-10-11 DIAGNOSIS — Z30018 Encounter for initial prescription of other contraceptives: Secondary | ICD-10-CM

## 2015-10-11 MED ORDER — ETONOGESTREL-ETHINYL ESTRADIOL 0.12-0.015 MG/24HR VA RING: 1.0000 | VAGINAL_RING | VAGINAL | Status: DC

## 2015-11-21 ENCOUNTER — Telehealth: Payer: Self-pay | Admitting: *Deleted

## 2015-11-21 ENCOUNTER — Telehealth: Payer: Self-pay | Admitting: Obstetrics

## 2015-11-21 ENCOUNTER — Other Ambulatory Visit: Payer: Self-pay | Admitting: *Deleted

## 2015-11-21 DIAGNOSIS — Z30018 Encounter for initial prescription of other contraceptives: Secondary | ICD-10-CM

## 2015-11-21 MED ORDER — ETONOGESTREL-ETHINYL ESTRADIOL 0.12-0.015 MG/24HR VA RING: 1.0000 | VAGINAL_RING | VAGINAL | Status: DC

## 2015-11-21 NOTE — Telephone Encounter (Signed)
Patient needs a refill of her birth control ring- she has [employer] and has to change her pharmacy.  Rx sent to alternative pharmacy as requested- patient is due her annual exam in March- call forwarded to [facility] for scheduling.

## 2015-11-22 NOTE — Telephone Encounter (Signed)
Patient AEX scheduled.

## 2015-12-15 ENCOUNTER — Ambulatory Visit (INDEPENDENT_AMBULATORY_CARE_PROVIDER_SITE_OTHER): Admitting: Certified Nurse Midwife

## 2015-12-15 VITALS — BP 121/85 | HR 66 | Wt 168.0 lb

## 2015-12-15 DIAGNOSIS — B9689 Other specified bacterial agents as the cause of diseases classified elsewhere: Secondary | ICD-10-CM

## 2015-12-15 DIAGNOSIS — N760 Acute vaginitis: Secondary | ICD-10-CM | POA: Diagnosis not present

## 2015-12-15 DIAGNOSIS — A499 Bacterial infection, unspecified: Secondary | ICD-10-CM

## 2015-12-15 DIAGNOSIS — N939 Abnormal uterine and vaginal bleeding, unspecified: Secondary | ICD-10-CM | POA: Diagnosis not present

## 2015-12-15 NOTE — Progress Notes (Signed)
Patient ID: Kelsey Willis, female   DOB: November 04, 1985, 30 y.o.   MRN: [id number]  Chief Complaint  Patient presents with  . Vaginal Bleeding    bleeding changes with Nuva Ring, painful intercourse    HPI Kelsey Willis is a 30 y.o. female.  Reports irregular vaginal bleeding for about 1 week.  Has not had this problem before.  Reports moderate amount of bleeding like a normal period.  Is currently sexually active with her spouse, denies any new/other partners.  Reports vaginal intercourse being more difficult the last month or so.  Encouraged fore play and various positions.  Has not had that problem before either.  Reports setting alarms for her Nuva Ring and leaves it in for three weeks removes for one week and uses a new one.  Likes her Paediatric nurse.  Reports having issues with BV in the past.  Denies any vaginal itching.  Does report vaginal odor, but she is unsure if it is related to her current period like bleeding.  Discussed continous use of Nuva Ring.  Patient agrees with the plan.  Is due for an annual exam.   States took a home pregnancy test: was negative.    HPI  Past Medical History  Diagnosis Date  . Medical history non-contributory     Past Surgical History  Procedure Laterality Date  . Wisdom tooth extraction    . Cesarean section      Family History  Problem Relation Age of Onset  . Cancer Mother   . Asthma Mother   . Heart disease Father   . Cancer Maternal Grandmother   . Cancer Paternal Grandmother   . Diabetes Paternal Grandfather   . Heart disease Paternal Grandfather     Social History Social History  Substance Use Topics  . Smoking status: Never Smoker   . Smokeless tobacco: Never Used  . Alcohol Use: No    No Known Allergies  Current Outpatient Prescriptions  Medication Sig Dispense Refill  . etonogestrel-ethinyl estradiol (NUVARING) 0.12-0.015 MG/24HR vaginal ring Place 1 each vaginally every 21 ( twenty-one) days. Insert one (1) ring  vaginally and leave in place for three (3) weeks, then remove for one (1) week. 1 each 5  . loratadine (CLARITIN) 10 MG tablet Take 1 tablet (10 mg total) by mouth daily. 30 tablet 11  . famotidine (PEPCID) 20 MG tablet Take 1 tablet (20 mg total) by mouth 2 (two) times daily. (Patient not taking: Reported on 12/15/2015) 6 tablet 0  . Itraconazole 200 MG TABS Take 200 tablets by mouth daily. 14 tablet 0  . loratadine (CLARITIN) 5 MG/5ML syrup Take by mouth.    . meloxicam (MOBIC) 15 MG tablet Take 1 tablet (15 mg total) by mouth daily. (Patient not taking: Reported on 12/15/2015) 30 tablet 2   No current facility-administered medications for this visit.    Review of Systems Review of Systems Constitutional: negative for fatigue and weight loss Respiratory: negative for cough and wheezing Cardiovascular: negative for chest pain, fatigue and palpitations Gastrointestinal: negative for abdominal pain and change in bowel habits Genitourinary: + irregular bleeding with Nuva Ring Integument/breast: negative for nipple discharge Musculoskeletal:negative for myalgias Neurological: negative for gait problems and tremors Behavioral/Psych: negative for abusive relationship, depression Endocrine: negative for temperature intolerance     Blood pressure 121/85, pulse 66, weight 168 lb (76.204 kg), last menstrual period 11/23/2015.  Physical Exam Physical Exam General:   alert  Skin:   no rash or abnormalities  Lungs:   clear to auscultation bilaterally  Heart:   regular rate and rhythm, S1, S2 normal, no murmur, click, rub or gallop  Breasts:   deferred  Abdomen:  normal findings: no organomegaly, soft, non-tender and no hernia  Pelvis:  External genitalia: normal general appearance Urinary system: urethral meatus normal and bladder without fullness, nontender Vaginal: normal without tenderness, induration or masses Cervix: normal appearance, no CMT, + thin gray vaginal discharge.   Adnexa:  normal bimanual exam Uterus: anteverted and non-tender, normal size    50% of 25 min visit spent on counseling and coordination of care.   Data Reviewed Previous medical hx, meds  Assessment     AUB Chronic BV  Contraception management Sexual arousal/intercourse pain    Plan   RX: cuddle cream, boric acid vaginal suppositories  Nuva Ring sample: encouraged patient to go ahead and use  Orders Placed This Encounter  Procedures  . SureSwab, Vaginosis/Vaginitis Plus   No orders of the defined types were placed in this encounter.     Possible management options include: another form of contraception Follow up with annual exam.

## 2015-12-16 ENCOUNTER — Encounter: Payer: Self-pay | Admitting: Certified Nurse Midwife

## 2015-12-16 DIAGNOSIS — B9689 Other specified bacterial agents as the cause of diseases classified elsewhere: Secondary | ICD-10-CM | POA: Insufficient documentation

## 2015-12-16 DIAGNOSIS — N760 Acute vaginitis: Secondary | ICD-10-CM

## 2015-12-21 ENCOUNTER — Other Ambulatory Visit: Payer: Self-pay | Admitting: Certified Nurse Midwife

## 2015-12-21 DIAGNOSIS — N760 Acute vaginitis: Principal | ICD-10-CM

## 2015-12-21 DIAGNOSIS — B9689 Other specified bacterial agents as the cause of diseases classified elsewhere: Secondary | ICD-10-CM

## 2015-12-21 LAB — SURESWAB, VAGINOSIS/VAGINITIS PLUS
ATOPOBIUM VAGINAE: NOT DETECTED Log (cells/mL)
C. ALBICANS, DNA: NOT DETECTED
C. GLABRATA, DNA: NOT DETECTED
C. parapsilosis, DNA: NOT DETECTED
C. trachomatis RNA, TMA: NOT DETECTED
C. tropicalis, DNA: NOT DETECTED
Gardnerella vaginalis: >8 Log (cells/mL)
LACTOBACILLUS SPECIES: NOT DETECTED Log (cells/mL)
MEGASPHAERA SPECIES: NOT DETECTED Log (cells/mL)
N. gonorrhoeae RNA, TMA: NOT DETECTED
T. vaginalis RNA, QL TMA: NOT DETECTED

## 2015-12-21 MED ORDER — METRONIDAZOLE 500 MG PO TABS: 500.0000 mg | ORAL_TABLET | Freq: Two times a day (BID) | ORAL | Status: DC

## 2015-12-29 ENCOUNTER — Ambulatory Visit (INDEPENDENT_AMBULATORY_CARE_PROVIDER_SITE_OTHER): Admitting: Certified Nurse Midwife

## 2015-12-29 ENCOUNTER — Encounter: Payer: Self-pay | Admitting: Certified Nurse Midwife

## 2015-12-29 VITALS — BP 118/83 | HR 66 | Ht 67.5 in | Wt 162.0 lb

## 2015-12-29 DIAGNOSIS — Z01419 Encounter for gynecological examination (general) (routine) without abnormal findings: Secondary | ICD-10-CM

## 2015-12-29 DIAGNOSIS — Z809 Family history of malignant neoplasm, unspecified: Secondary | ICD-10-CM

## 2015-12-29 NOTE — Progress Notes (Signed)
Patient ID: Kelsey Willis, female   DOB: February 06, 1986, 30 y.o.   MRN: [id number]    Subjective:      Kelsey Willis is a 30 y.o. female here for a routine exam.  Current complaints: none.  Is happy with Nuva Ring continuous.  Currently sexually active.   Is doing her breast self exams.  Declines blood testing for STDs.  Is in [employer].   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  Using replens for vaginal dryness and states that it is helping.  Has lost over 80# this year.    Personal health questionnaire:  Is patient Ashkenazi Jewish, have a family history of breast and/or ovarian cancer: yes Is there a family history of uterine cancer diagnosed at age < 4, gastrointestinal cancer, urinary tract cancer, family member who is a Personnel officer syndrome-associated carrier: yes Is the patient overweight and hypertensive, family history of diabetes, personal history of gestational diabetes, preeclampsia or PCOS: no Is patient over 82, have PCOS,  family history of premature CHD under age 40, diabetes, smoke, have hypertension or peripheral artery disease:  yes At any time, has a partner hit, kicked or otherwise hurt or frightened you?: no Over the past 2 weeks, have you felt down, depressed or hopeless?: no Over the past 2 weeks, have you felt little interest or pleasure in doing things?:no   Gynecologic History Patient's last menstrual period was 12/14/2015. Contraception: NuvaRing vaginal inserts Last Pap: 12/28/14. Results were: normal Last mammogram: N/A.   Obstetric History OB History  Gravida Para Term Preterm AB SAB TAB Ectopic Multiple Living  1             # Outcome Date GA Lbr Len/2nd Weight Sex Delivery Anes PTL Lv  1 Gravida               Past Medical History  Diagnosis Date  . Medical history non-contributory     Past Surgical History  Procedure Laterality Date  . Wisdom tooth extraction    . Cesarean section       Current  outpatient prescriptions:  .  etonogestrel-ethinyl estradiol (NUVARING) 0.12-0.015 MG/24HR vaginal ring, Place 1 each vaginally every 21 ( twenty-one) days. Insert one (1) ring vaginally and leave in place for three (3) weeks, then remove for one (1) week., Disp: 1 each, Rfl: 5 .  loratadine (CLARITIN) 10 MG tablet, Take 1 tablet (10 mg total) by mouth daily., Disp: 30 tablet, Rfl: 11 .  famotidine (PEPCID) 20 MG tablet, Take 1 tablet (20 mg total) by mouth 2 (two) times daily. (Patient not taking: Reported on 12/15/2015), Disp: 6 tablet, Rfl: 0 .  Itraconazole 200 MG TABS, Take 200 tablets by mouth daily. (Patient not taking: Reported on 12/29/2015), Disp: 14 tablet, Rfl: 0 Allergies  Allergen Reactions  . Penicillins Other (See Comments)    Skin burning    Social History  Substance Use Topics  . Smoking status: Never Smoker   . Smokeless tobacco: Never Used  . Alcohol Use: No    Family History  Problem Relation Age of Onset  . Cancer Mother   . Asthma Mother   . Heart disease Father   . Cancer Maternal Grandmother   . Cancer Paternal Grandmother   . Diabetes Paternal Grandfather   . Heart disease Paternal Grandfather       Review of Systems  Constitutional: negative for fatigue and weight loss Respiratory: negative for cough and  wheezing Cardiovascular: negative for chest pain, fatigue and palpitations Gastrointestinal: negative for abdominal pain and change in bowel habits Musculoskeletal:negative for myalgias Neurological: negative for gait problems and tremors Behavioral/Psych: negative for abusive relationship, depression Endocrine: negative for temperature intolerance   Genitourinary:negative for abnormal menstrual periods, genital lesions, hot flashes, sexual problems and vaginal discharge Integument/breast: negative for breast lump, breast tenderness, nipple discharge and skin lesion(s)    Objective:       BP 118/83 mmHg  Pulse 66  Ht 5' 7.5" (1.715 m)  Wt 162 lb  (73.483 kg)  BMI 24.98 kg/m2  LMP 12/14/2015 General:   alert  Skin:   no rash or abnormalities  Lungs:   clear to auscultation bilaterally  Heart:   regular rate and rhythm, S1, S2 normal, no murmur, click, rub or gallop  Breasts:   normal without suspicious masses, skin or nipple changes or axillary nodes  Abdomen:  normal findings: no organomegaly, soft, non-tender and no hernia  Pelvis:  External genitalia: normal general appearance Urinary system: urethral meatus normal and bladder without fullness, nontender Vaginal: normal without tenderness, induration or masses Cervix: normal appearance Adnexa: normal bimanual exam Uterus: anteverted and non-tender, normal size   Lab Review Urine pregnancy test Labs reviewed yes Radiologic studies reviewed no  50% of 30 min visit spent on counseling and coordination of care.   Assessment:    Healthy female exam.    Contraception survalence  Family hx of cancer  Plan:    Education reviewed: calcium supplements, depression evaluation, low fat, low cholesterol diet, safe sex/STD prevention, self breast exams, skin cancer screening and weight bearing exercise. Contraception: NuvaRing vaginal inserts. Follow up in: 1 year.   No orders of the defined types were placed in this encounter.   Orders Placed This Encounter  Procedures  . SureSwab Bacterial Vaginosis/itis  . Ambulatory referral to Genetics    Referral Priority:  Routine    Referral Type:  Consultation    Referral Reason:  Specialty Services Required    Number of Visits Requested:  1

## 2015-12-30 LAB — PAP IG W/ RFLX HPV ASCU

## 2016-01-03 LAB — SURESWAB BACTERIAL VAGINOSIS/ITIS
Atopobium vaginae: NOT DETECTED Log (cells/mL)
C. ALBICANS, DNA: NOT DETECTED
C. GLABRATA, DNA: NOT DETECTED
C. TROPICALIS, DNA: NOT DETECTED
C. parapsilosis, DNA: NOT DETECTED
Gardnerella vaginalis: 6.2 Log (cells/mL)
LACTOBACILLUS SPECIES: NOT DETECTED Log (cells/mL)
MEGASPHAERA SPECIES: NOT DETECTED Log (cells/mL)
T. vaginalis RNA, QL TMA: NOT DETECTED

## 2016-01-04 ENCOUNTER — Other Ambulatory Visit: Payer: Self-pay | Admitting: Certified Nurse Midwife

## 2016-01-04 DIAGNOSIS — B9689 Other specified bacterial agents as the cause of diseases classified elsewhere: Secondary | ICD-10-CM

## 2016-01-04 DIAGNOSIS — N760 Acute vaginitis: Principal | ICD-10-CM

## 2016-01-04 MED ORDER — METRONIDAZOLE 500 MG PO TABS: 500.0000 mg | ORAL_TABLET | Freq: Two times a day (BID) | ORAL | Status: DC

## 2016-01-06 ENCOUNTER — Encounter: Payer: Self-pay | Admitting: Oncology

## 2016-01-06 ENCOUNTER — Telehealth: Payer: Self-pay | Admitting: Genetic Counselor

## 2016-01-06 NOTE — Telephone Encounter (Signed)
Verified address and insurance, faxed referring provider letter, mailed out new pt packet, scheduled intake

## 2016-01-10 ENCOUNTER — Other Ambulatory Visit: Payer: Self-pay | Admitting: Certified Nurse Midwife

## 2016-01-13 ENCOUNTER — Encounter: Payer: Self-pay | Admitting: *Deleted

## 2016-01-30 ENCOUNTER — Other Ambulatory Visit: Payer: Self-pay | Admitting: *Deleted

## 2016-01-30 DIAGNOSIS — Z30018 Encounter for initial prescription of other contraceptives: Secondary | ICD-10-CM

## 2016-01-30 MED ORDER — ETONOGESTREL-ETHINYL ESTRADIOL 0.12-0.015 MG/24HR VA RING: 1.0000 | VAGINAL_RING | VAGINAL | Status: DC

## 2016-02-08 ENCOUNTER — Other Ambulatory Visit

## 2016-02-08 ENCOUNTER — Encounter: Admitting: Genetic Counselor

## 2016-10-03 ENCOUNTER — Ambulatory Visit (INDEPENDENT_AMBULATORY_CARE_PROVIDER_SITE_OTHER)

## 2016-10-03 ENCOUNTER — Telehealth: Payer: Self-pay

## 2016-10-03 DIAGNOSIS — Z3201 Encounter for pregnancy test, result positive: Secondary | ICD-10-CM

## 2016-10-03 DIAGNOSIS — Z3490 Encounter for supervision of normal pregnancy, unspecified, unspecified trimester: Secondary | ICD-10-CM

## 2016-10-03 LAB — POCT URINE PREGNANCY: PREG TEST UR: POSITIVE — AB

## 2016-10-03 NOTE — Telephone Encounter (Signed)
Returned pt call and patient stated that she no longer needs a medication refill.

## 2016-10-03 NOTE — Progress Notes (Signed)
Patient is in the office for UPT, results are positive. Patient is unsure of LMP because she has been on NuvaRing.

## 2016-10-04 ENCOUNTER — Ambulatory Visit (INDEPENDENT_AMBULATORY_CARE_PROVIDER_SITE_OTHER): Payer: BLUE CROSS/BLUE SHIELD

## 2016-10-04 ENCOUNTER — Other Ambulatory Visit: Payer: BLUE CROSS/BLUE SHIELD

## 2016-10-04 ENCOUNTER — Other Ambulatory Visit: Payer: Self-pay | Admitting: Certified Nurse Midwife

## 2016-10-04 DIAGNOSIS — Z3491 Encounter for supervision of normal pregnancy, unspecified, first trimester: Secondary | ICD-10-CM

## 2016-10-04 DIAGNOSIS — Z3490 Encounter for supervision of normal pregnancy, unspecified, unspecified trimester: Secondary | ICD-10-CM

## 2016-10-05 LAB — BETA HCG QUANT (REF LAB): hCG Quant: 654 m[IU]/mL

## 2016-10-11 ENCOUNTER — Other Ambulatory Visit: Payer: BLUE CROSS/BLUE SHIELD

## 2016-10-11 DIAGNOSIS — Z3201 Encounter for pregnancy test, result positive: Secondary | ICD-10-CM

## 2016-10-11 NOTE — Progress Notes (Signed)
Repeat B-HCG.

## 2016-10-12 ENCOUNTER — Telehealth: Payer: Self-pay

## 2016-10-12 LAB — BETA HCG QUANT (REF LAB): hCG Quant: 7266 m[IU]/mL

## 2016-10-12 NOTE — Telephone Encounter (Signed)
Patient called in for lab results  ?

## 2016-10-26 ENCOUNTER — Other Ambulatory Visit: Payer: Self-pay | Admitting: Certified Nurse Midwife

## 2016-11-12 ENCOUNTER — Encounter: Payer: BLUE CROSS/BLUE SHIELD | Admitting: Obstetrics and Gynecology

## 2016-11-16 ENCOUNTER — Other Ambulatory Visit: Payer: Self-pay | Admitting: Obstetrics and Gynecology

## 2016-11-16 DIAGNOSIS — O3430 Maternal care for cervical incompetence, unspecified trimester: Secondary | ICD-10-CM

## 2016-11-16 LAB — OB RESULTS CONSOLE RUBELLA ANTIBODY, IGM: Rubella: IMMUNE

## 2016-11-16 LAB — OB RESULTS CONSOLE HIV ANTIBODY (ROUTINE TESTING): HIV: NONREACTIVE

## 2016-11-16 LAB — OB RESULTS CONSOLE HEPATITIS B SURFACE ANTIGEN: Hepatitis B Surface Ag: NEGATIVE

## 2016-11-20 ENCOUNTER — Other Ambulatory Visit: Payer: Self-pay | Admitting: Obstetrics and Gynecology

## 2016-11-20 ENCOUNTER — Ambulatory Visit (HOSPITAL_COMMUNITY)
Admission: RE | Admit: 2016-11-20 | Discharge: 2016-11-20 | Disposition: A | Discharge status: Home / Self Care | Payer: Managed Care, Other (non HMO) | Source: Ambulatory Visit | Attending: Obstetrics and Gynecology | Admitting: Obstetrics and Gynecology

## 2016-11-20 DIAGNOSIS — Z3689 Encounter for other specified antenatal screening: Secondary | ICD-10-CM | POA: Diagnosis not present

## 2016-11-20 DIAGNOSIS — Z3A11 11 weeks gestation of pregnancy: Secondary | ICD-10-CM | POA: Diagnosis not present

## 2016-11-20 DIAGNOSIS — Z3491 Encounter for supervision of normal pregnancy, unspecified, first trimester: Secondary | ICD-10-CM

## 2016-12-11 ENCOUNTER — Encounter (HOSPITAL_COMMUNITY)
Admission: RE | Admit: 2016-12-11 | Discharge: 2016-12-11 | Disposition: A | Discharge status: Home / Self Care | Payer: Managed Care, Other (non HMO) | Source: Ambulatory Visit | Attending: Obstetrics and Gynecology | Admitting: Obstetrics and Gynecology

## 2016-12-11 ENCOUNTER — Encounter (HOSPITAL_COMMUNITY): Payer: Self-pay | Admitting: Obstetrics and Gynecology

## 2016-12-11 ENCOUNTER — Encounter (HOSPITAL_COMMUNITY): Payer: Self-pay

## 2016-12-11 DIAGNOSIS — O3432 Maternal care for cervical incompetence, second trimester: Secondary | ICD-10-CM | POA: Diagnosis present

## 2016-12-11 DIAGNOSIS — Z809 Family history of malignant neoplasm, unspecified: Secondary | ICD-10-CM | POA: Diagnosis not present

## 2016-12-11 DIAGNOSIS — Z3A14 14 weeks gestation of pregnancy: Secondary | ICD-10-CM | POA: Diagnosis not present

## 2016-12-11 DIAGNOSIS — Z825 Family history of asthma and other chronic lower respiratory diseases: Secondary | ICD-10-CM | POA: Diagnosis not present

## 2016-12-11 DIAGNOSIS — Z833 Family history of diabetes mellitus: Secondary | ICD-10-CM | POA: Diagnosis not present

## 2016-12-11 HISTORY — DX: Headache, unspecified: R51.9

## 2016-12-11 HISTORY — DX: Headache: R51

## 2016-12-11 LAB — CBC
HCT: 36.2 % (ref 36.0–46.0)
Hemoglobin: 12.9 g/dL (ref 12.0–15.0)
MCH: 28.8 pg (ref 26.0–34.0)
MCHC: 35.6 g/dL (ref 30.0–36.0)
MCV: 80.8 fL (ref 78.0–100.0)
PLATELETS: 269 10*3/uL (ref 150–400)
RBC: 4.48 MIL/uL (ref 3.87–5.11)
RDW: 14.1 % (ref 11.5–15.5)
WBC: 7.5 10*3/uL (ref 4.0–10.5)

## 2016-12-11 LAB — TYPE AND SCREEN
ABO/RH(D): B POS
Antibody Screen: NEGATIVE

## 2016-12-11 NOTE — H&P (Addendum)
Kelsey LevyChristina L Willis is a 31 y.o. female G2 33P0101 @ 14 3/7 weeks revised by an 11 week ultrasound presenting for cerclage placement.  Pt has a h/o painless dilitation 25 weeks.  Presented at 5 cm then to complete and ultimately had a classical cesarean section.  Pt was advised to have a cerclage with her next pregnancy. Pt has had uncomplicated prenatal care.  OB History    Gravida Para Term Preterm AB Living   2             SAB TAB Ectopic Multiple Live Births                 Past Medical History:  Diagnosis Date  . Headache    have noticed since pregnancy  . Medical history non-contributory    Past Surgical History:  Procedure Laterality Date  . CESAREAN SECTION    . WISDOM TOOTH EXTRACTION     Family History: family history includes Asthma in her mother; Cancer in her maternal grandmother, mother, and paternal grandmother; Diabetes in her paternal grandfather; Heart disease in her father and paternal grandfather. Social History:  reports that she has never smoked. She has never used smokeless tobacco. She reports that she drinks alcohol. She reports that she does not use drugs.  Review of Systems  Constitutional: Negative for chills and fever.   Maternal Medical History:  Prenatal complications: no prenatal complications     There were no vitals taken for this visit. Maternal Exam:  Introitus: Normal vulva.   Fetal Exam Fetal Monitor Review: Mode: hand-held doppler probe.   Baseline rate: 150s.      Physical Exam  Constitutional: She is oriented to person, place, and time. She appears well-developed and well-nourished.  HENT:  Head: Normocephalic and atraumatic.  Eyes: EOM are normal.  Neck: Normal range of motion.  Respiratory: Effort normal. No respiratory distress.  GI: Soft. There is no tenderness.  Musculoskeletal: Normal range of motion. She exhibits no edema or tenderness.  Neurological: She is alert and oriented to person, place, and time.  Skin: Skin is  warm and dry.  Psychiatric: She has a normal mood and affect.    Prenatal labs: ABO, Rh: --/--/B POS (02/13 1500) Antibody: NEG (02/13 1500) Rubella:   RPR:    HBsAg:    HIV:    GBS:     Assessment/Plan: IUP @ 14 3/7 weeks H/o Incompetent cervix presents for cerclage placement. Pt counseled on risk of procedure including bleeding, infection, rupture of membranes, miscarriage.  Pt desires to proceed. Plan for [facility] cerclage.   Geryl RankinsVARNADO, Dalaya Suppa 12/11/2016, 9:17 PM

## 2016-12-11 NOTE — Patient Instructions (Signed)
Your procedure is scheduled on:  Tomorrow, Feb. 14, 2018  [facility] at:  10:45 AM  Pick up the phone at the desk and dial [phone].  Call this number if you have problems the morning of surgery: [phone].  Remember: Do NOT eat food:  After 4:15 AM day of surgery  Do NOT drink clear liquids after:  8:15 AM day of surgery  Take these medicines the morning of surgery with a SIP OF WATER:  None  Stop ALL herbal medications at this time  Do NOT smoke the day of surgery.  Do NOT wear jewelry (body piercing), metal hair clips/bobby pins, make-up, or nail polish. Do NOT wear lotions, powders, or perfumes.  You may wear deodorant. Do NOT shave for 48 hours prior to surgery. Do NOT bring valuables to the hospital. Contacts, dentures, or bridgework may not be worn into surgery.  Have a responsible adult drive you home and stay with you for 24 hours after your procedure  Bring a copy of your healthcare power of attorney and living will documents.  **Effective Friday, Jan. 12, 2018,  will implement no hospital visitations from children age 31 and younger due to a steady increase in flu activity in our community and hospitals. **

## 2016-12-12 ENCOUNTER — Ambulatory Visit (HOSPITAL_COMMUNITY)
Admission: RE | Admit: 2016-12-12 | Discharge: 2016-12-12 | Disposition: A | Discharge status: Home / Self Care | Payer: Managed Care, Other (non HMO) | Source: Ambulatory Visit | Attending: Obstetrics and Gynecology | Admitting: Obstetrics and Gynecology

## 2016-12-12 ENCOUNTER — Ambulatory Visit (HOSPITAL_COMMUNITY): Payer: Managed Care, Other (non HMO) | Admitting: Anesthesiology

## 2016-12-12 ENCOUNTER — Encounter (HOSPITAL_COMMUNITY)
Admission: RE | Disposition: A | Discharge status: Home / Self Care | Payer: Self-pay | Source: Ambulatory Visit | Attending: Obstetrics and Gynecology

## 2016-12-12 DIAGNOSIS — Z809 Family history of malignant neoplasm, unspecified: Secondary | ICD-10-CM | POA: Insufficient documentation

## 2016-12-12 DIAGNOSIS — Z825 Family history of asthma and other chronic lower respiratory diseases: Secondary | ICD-10-CM | POA: Insufficient documentation

## 2016-12-12 DIAGNOSIS — O3432 Maternal care for cervical incompetence, second trimester: Secondary | ICD-10-CM | POA: Diagnosis not present

## 2016-12-12 DIAGNOSIS — Z833 Family history of diabetes mellitus: Secondary | ICD-10-CM | POA: Insufficient documentation

## 2016-12-12 DIAGNOSIS — Z3A14 14 weeks gestation of pregnancy: Secondary | ICD-10-CM | POA: Insufficient documentation

## 2016-12-12 HISTORY — PX: CERVICAL CERCLAGE: SHX1329

## 2016-12-12 LAB — ABO/RH: ABO/RH(D): B POS

## 2016-12-12 SURGERY — CERCLAGE, CERVIX, VAGINAL APPROACH
Anesthesia: Spinal | Site: Cervix

## 2016-12-12 MED ORDER — FENTANYL CITRATE (PF) 100 MCG/2ML IJ SOLN: 25.0000 ug | INTRAMUSCULAR | Status: DC | PRN

## 2016-12-12 MED ORDER — LACTATED RINGERS IV SOLN
INTRAVENOUS | Status: DC
  Administered 2016-12-12: 13:00:00 via INTRAVENOUS
  Administered 2016-12-12: 125 mL/h via INTRAVENOUS

## 2016-12-12 MED ORDER — ACETAMINOPHEN ER 650 MG PO TBCR: 650.0000 mg | EXTENDED_RELEASE_TABLET | Freq: Three times a day (TID) | ORAL | 0 refills | Status: DC | PRN

## 2016-12-12 MED ORDER — INDOMETHACIN 20 MG PO CAPS: 25.0000 mg | ORAL_CAPSULE | Freq: Three times a day (TID) | ORAL | 0 refills | Status: AC

## 2016-12-12 MED ORDER — 0.9 % SODIUM CHLORIDE (POUR BTL) OPTIME
TOPICAL | Status: DC | PRN
  Administered 2016-12-12: 1000 mL

## 2016-12-12 SURGICAL SUPPLY — 18 items
CANISTER SUCT 3000ML (MISCELLANEOUS) ×3 IMPLANT
CLOTH BEACON ORANGE TIMEOUT ST (SAFETY) ×3 IMPLANT
COUNTER NEEDLE 1200 MAGNETIC (NEEDLE) ×3 IMPLANT
GLOVE BIO SURGEON STRL SZ7 (GLOVE) ×3 IMPLANT
GLOVE BIOGEL PI IND STRL 7.0 (GLOVE) ×2 IMPLANT
GLOVE BIOGEL PI INDICATOR 7.0 (GLOVE) ×4
GOWN STRL REUS W/TWL LRG LVL3 (GOWN DISPOSABLE) ×6 IMPLANT
NEEDLE MAYO CATGUT SZ4 (NEEDLE) ×3 IMPLANT
PACK VAGINAL MINOR WOMEN LF (CUSTOM PROCEDURE TRAY) ×3 IMPLANT
PAD OB MATERNITY 4.3X12.25 (PERSONAL CARE ITEMS) ×3 IMPLANT
PAD PREP 24X48 CUFFED NSTRL (MISCELLANEOUS) ×3 IMPLANT
SUT PROLENE 0 TP 1 60 (SUTURE) ×3 IMPLANT
SYR BULB IRRIGATION 50ML (SYRINGE) ×3 IMPLANT
TOWEL OR 17X24 6PK STRL BLUE (TOWEL DISPOSABLE) ×6 IMPLANT
TRAY FOLEY CATH SILVER 14FR (SET/KITS/TRAYS/PACK) ×3 IMPLANT
TUBING NON-CON 1/4 X 20 CONN (TUBING) ×2 IMPLANT
TUBING NON-CON 1/4 X 20' CONN (TUBING) ×1
YANKAUER SUCT BULB TIP NO VENT (SUCTIONS) ×3 IMPLANT

## 2016-12-12 NOTE — Anesthesia Preprocedure Evaluation (Signed)
Anesthesia Evaluation  Patient identified by MRN, date of birth, ID band Patient awake    Reviewed: Allergy & Precautions, H&P , Patient's Chart, lab work & pertinent test results  Airway Mallampati: II  TM Distance: >3 FB Neck ROM: full    Dental no notable dental hx.    Pulmonary    Pulmonary exam normal breath sounds clear to auscultation       Cardiovascular Exercise Tolerance: Good  Rhythm:regular Rate:Normal     Neuro/Psych    GI/Hepatic   Endo/Other    Renal/GU      Musculoskeletal   Abdominal   Peds  Hematology   Anesthesia Other Findings   Reproductive/Obstetrics                             Anesthesia Physical Anesthesia Plan  ASA: II  Anesthesia Plan: Spinal   Post-op Pain Management:    Induction:   Airway Management Planned:   Additional Equipment:   Intra-op Plan:   Post-operative Plan:   Informed Consent: I have reviewed the patients History and Physical, chart, labs and discussed the procedure including the risks, benefits and alternatives for the proposed anesthesia with the patient or authorized representative who has indicated his/her understanding and acceptance.   Dental Advisory Given  Plan Discussed with: CRNA  Anesthesia Plan Comments: (Lab work and procedure  confirmed with CRNA in room; platelets okay. Discussed spinal anesthetic, and patient consents to the procedure:  included risk of possible headache,backache, failed block, allergic reaction, and nerve injury. This patient was asked if she had any questions or concerns before the procedure started.  )        Anesthesia Quick Evaluation  

## 2016-12-12 NOTE — Interval H&P Note (Signed)
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 After consideration of risks, benefits and other options for treatment, the patient has consented to  Procedure(s): CERCLAGE CERVICAL (N/A) as a surgical intervention .  The patient's history has been reviewed, patient examined, no change in status, stable for surgery.  I have reviewed the patient's chart and labs.  Questions were answered to the patient's satisfaction.     Dion BodyVARNADO, Malaiya Paczkowski

## 2016-12-12 NOTE — Anesthesia Procedure Notes (Signed)

## 2016-12-12 NOTE — Discharge Instructions (Addendum)
Cervical Cerclage Cervical cerclage is a surgical procedure to correct a cervix that opens up and thins out before pregnancy is at term (cervical insufficiency, also called incompetent cervix). This condition can cause labor to start early (prematurely). This procedure involves using stitches to sew the cervix shut during pregnancy. Your surgeon may use ultrasound equipment to help guide the procedure and monitor your baby. Ultrasound equipment uses sound waves to take images of your cervix and uterus. Your surgeon will assess these images on a monitor in the operating room. Tell a health care provider about:  Any allergies you have, especially any allergies related to prescribed medicine, stitches, or anesthetic medicines.  All medicines you are taking, including vitamins, herbs, eye drops, creams, and over-the-counter medicines. Bring a list of all of your medicines to your appointment.  Your medical history, including prior labor deliveries.  Any problems you or family members have had with anesthetic medicines.  Any blood disorders you have.  Any surgeries you have had, including prior cervical stitching.  Any medical conditions you have.  Whether you are pregnant or may be pregnant. What are the risks? Generally, this is a safe procedure. However, problems may occur, including:  Infection, such as infection of the cervix or amniotic sac.  Vaginal bleeding.  Allergic reactions to medicines.  Damage to other structures or organs, such as tearing (rupture) of membranes or cervical laceration.  Premature contractions including going into early labor and delivery.  Cervical dystocia, which occurs when the cervix is unable to dilate normally during labor. What happens before the procedure? Staying hydrated  Follow instructions from your health care provider about hydration, which may include:  Up to 2 hours before the procedure - you may continue to drink clear liquids, such as  water, clear fruit juice, black coffee, and plain tea. Eating and drinking restrictions  Follow instructions from your health care provider about eating and drinking, which may include:  8 hours before the procedure - stop eating heavy meals or foods such as meat, fried foods, or fatty foods.  6 hours before the procedure - stop eating light meals or foods, such as toast or cereal.  6 hours before the procedure - stop drinking milk or drinks that contain milk.  2 hours before the procedure - stop drinking clear liquids. Medicines   Ask your health care provider about:  Changing or stopping your regular medicines. This is especially important if you are taking diabetes medicines or blood thinners.  Taking medicines such as aspirin and ibuprofen. These medicines can thin your blood. Do not take these medicines before your procedure if your health care provider instructs you not to.  You may be given antibiotic medicine to help prevent infection. General instructions   Do not put on any lotion, deodorant, or perfume.  Remove contact lenses and jewelry.  Ask your health care provider how your surgical site will be marked or identified.  You may have an exam or testing.  You may have a blood or urine sample taken.  Plan to have someone take you home from the hospital or clinic.  If you will be going home right after the procedure, plan to have someone with you for 24 hours. What happens during the procedure?  To reduce your risk of infection:  Your health care team will wash or sanitize their hands.  Your skin will be washed with soap.  An IV tube will be inserted into one of your veins.  You may be   of the following:  A medicine to help you relax (sedative).  A medicine to numb the area (local anesthetic).  A medicine to make you fall asleep (general anesthetic).  A medicine that is injected into your spine to numb the area below and slightly above the  injection site (spinal anesthetic).  A medicine that is injected into an area of your body to numb everything below the injection site (regional anesthetic).  A lubricated instrument (speculum) will be inserted into your vagina. The speculum will be widened to open the walls of your vagina so your surgeon can see your cervix.  Your cervix will be grasped and tightly stitched closed (sutured). To do this, your surgeon will stitch a strong band of thread around your cervix, then the thread will be tightened to hold your cervix shut. The procedure may vary among health care providers and hospitals. What happens after the procedure?  Your blood pressure, heart rate, breathing rate, and blood oxygen level will be monitored until the medicines you were given have worn off. You will be monitored for premature contractions.  You may have light bleeding and mild cramping.  You may have to wear compression stockings. These stockings help to prevent blood clots and reduce swelling in your legs.  Do not drive for 24 hours if you received a sedative.  You may be put on bed rest.  You may be given medicine to prevent infection.  You may be given an injection of a hormone (progesterone) to prevent your uterus from tightening (contracting). Summary  Cervical cerclage is a surgical procedure that involves using stitches to sew the cervix shut during pregnancy.  Your blood pressure, heart rate, breathing rate, and blood oxygen level will be monitored until the medicines you were given have worn off. You will be monitored for premature contractions.  You may need to be on bed rest after the procedure.  Plan to have someone take you home from the hospital or clinic. This information is not intended to replace advice given to you by your health care provider. Make sure you discuss any questions you have with your health care provider. Document Released: 09/27/2008 Document Revised: 06/08/2016 Document  Reviewed: 05/31/2016 Elsevier Interactive Patient Education  2017 [employer]     Cervical Cerclage, Care After This sheet gives you information about how to care for yourself after your procedure. Your health care provider may also give you more specific instructions. If you have problems or questions, contact your health care provider. What can I expect after the procedure? After your procedure, it is common to have:  Cramping in your abdomen.  Mucus discharge for several days.  Painful urination (dysuria).  Small drops of blood coming from your vagina (spotting). Follow these instructions at home:  Follow instructions from your health care provider about bed rest, if this applies. You may need to be on bed rest for up to 3 days.  Take over-the-counter and prescription medicines only as told by your health care provider.  Do not drive or use heavy machinery while taking prescription pain medicine.  Keep track of your vaginal discharge and watch for any changes. If you notice changes, tell your health care provider.  Avoid physical activities and exercise until your health care provider approves. Ask your health care provider what activities are safe for you.  Until your health care provider approves:  Do not douche.  Do not have sexual intercourse.  Keep all pre-birth (prenatal) visits and all follow-up visits  as told by your health care provider. This is important. You will probably have weekly visits to have your cervix checked, and you may need an ultrasound. Contact a health care provider if:  You have abnormal or bad-smelling vaginal discharge, such as clots.  You develop a rash on your skin. This may look like redness and swelling.  You become light-headed or feel like you are going to faint.  You have abdominal pain that does not get better with medicine.  You have persistent nausea or vomiting. Get help right away if:  You have vaginal bleeding that is  heavier or more frequent than spotting.  You are leaking fluid or have a gush of fluid from your vagina (your water breaks).  You have a fever or chills.  You faint.  You have uterine contractions. These may feel like:  A back ache.  Lower abdominal pain.  Mild cramps, similar to menstrual cramps.  Tightening or pressure in your abdomen.  You think that your baby is not moving as much as usual, or you cannot feel your baby move.  You have chest pain.  You have shortness of breath. This information is not intended to replace advice given to you by your health care provider. Make sure you discuss any questions you have with your health care provider. Document Released: 08/05/2013 Document Revised: 06/13/2016 Document Reviewed: 05/18/2016 Elsevier Interactive Patient Education  2017 [employer].

## 2016-12-12 NOTE — Brief Op Note (Signed)
12/12/2016  1:29 PM  PATIENT:  Kelsey Willis  31 y.o. female  PRE-OPERATIVE DIAGNOSIS:  N88.3 Incompetent Cervix IUP @ 14 4/7  POST-OPERATIVE DIAGNOSIS:  INCOMPETENT CERVIX  PROCEDURE:  Procedure(s): CERCLAGE CERVICAL (N/A)  SURGEON:  Surgeon(s) and Role:    * Geryl RankinsEvelyn Kemper Hochman, MD - Primary  PHYSICIAN ASSISTANT:  None  ASSISTANTS: Techinician   ANESTHESIA:   spinal  EBL:  Total I/O In: 1000 [I.V.:1000] Out: 100 [Urine:100]  BLOOD ADMINISTERED:none  DRAINS: Urinary Catheter (Foley)   LOCAL MEDICATIONS USED:  NONE  SPECIMEN:  No Specimen  DISPOSITION OF SPECIMEN:  N/A  COUNTS:  YES  TOURNIQUET:  * No tourniquets in log *  DICTATION: .Other Dictation: Dictation Number [id number]  PLAN OF CARE: Discharge to home after PACU  PATIENT DISPOSITION:  PACU - hemodynamically stable.   Delay start of Pharmacological VTE agent (>24hrs) due to surgical blood loss or risk of bleeding: yes

## 2016-12-12 NOTE — Anesthesia Procedure Notes (Signed)
Spinal  Patient location during procedure: OR Staffing Anesthesiologist: Cristela BlueJACKSON, Kelsey Willis Preanesthetic Checklist Completed: patient identified, site marked, surgical consent, pre-op evaluation, timeout performed, IV checked, risks and benefits discussed and monitors and equipment checked Spinal Block Patient position: sitting Prep: DuraPrep Patient monitoring: heart rate, cardiac monitor, continuous pulse ox and blood pressure Approach: midline Location: L3-4 Injection technique: single-shot Needle Needle type: Sprotte  Needle gauge: 24 G Needle length: 9 cm Assessment Sensory level: T8 Additional Notes Spinal Dosage in OR  Bupivicaine ml       1.4

## 2016-12-13 NOTE — Transfer of Care (Signed)
Immediate Anesthesia Transfer of Care Note  Patient: Kelsey Willis  Procedure(s) Performed: Procedure(s): CERCLAGE CERVICAL (N/A)  Patient Location: PACU  Anesthesia Type:MAC  Level of Consciousness: awake, alert  and oriented  Airway & Oxygen Therapy: Patient Spontanous Breathing  Post-op Assessment: Report given to RN and Post -op Vital signs reviewed and stable  Post vital signs: Reviewed and stable  Last Vitals:  Vitals:   12/12/16 1445 12/12/16 1617  BP: 122/82 114/75  Pulse: 90 87  Resp: 18 20  Temp:  36.9 C    Last Pain:  Vitals:   12/12/16 1617  PainSc: 2          Complications: No apparent anesthesia complications

## 2016-12-14 ENCOUNTER — Encounter (HOSPITAL_COMMUNITY): Payer: Self-pay | Admitting: Obstetrics and Gynecology

## 2016-12-14 NOTE — Anesthesia Postprocedure Evaluation (Signed)
Anesthesia Post Note  Patient: Kelsey LevyChristina L Harshberger  Procedure(s) Performed: Procedure(s) (LRB): CERCLAGE CERVICAL (N/A)  Anesthesia Type: Spinal       Last Vitals:  Vitals:   12/12/16 1445 12/12/16 1617  BP: 122/82 114/75  Pulse: 90 87  Resp: 18 20  Temp:  36.9 C    Last Pain:  Vitals:   12/12/16 1617  PainSc: 2                  Samhitha Rosen EDWARD

## 2016-12-24 NOTE — Op Note (Signed)
NAMPablo Ledger:  [place], [place]           ACCOUNT NO.:  [id number]  MEDICAL RECORD NO.:  [id number]  LOCATION:  PERIO                         FACILITY:  [facility]  PHYSICIAN:  Pieter PartridgeEvelyn B Taeveon Keesling, MD   DATE OF BIRTH:  07/01/1986  DATE OF PROCEDURE:  12/12/2016 DATE OF DISCHARGE:                              OPERATIVE REPORT   PREOPERATIVE DIAGNOSES:  Intrauterine pregnancy at 14-4/7th weeks, history of incompetent cervix.  POSTOPERATIVE DIAGNOSES:  Intrauterine pregnancy at 14-4/7th weeks, history of incompetent cervix.  OPERATION:  Cervical cerclage.  SURGEON:  Shela Nevinvelyn B. Dion BodyVarnado, MD.  ASSISTANT:  Technician.  ANESTHESIA:  Spinal.  EBL:  10.  BLOOD ADMINISTERED:  None.  DRAINS:  Foley catheter.  LOCAL:  None.  SPECIMEN:  None.  COUNTS:  Correct.  DISPOSITION:  To PACU, hemodynamically stable.  COMPLICATIONS:  None.  FINDINGS:  Closed, firm cervix, visible; cervical length approximately 3 cm.  No vaginal infections.  No abnormal lesions.  PROCEDURE IN DETAIL:  Ms. Concepcion ElkFenwick was identified in the holding area. Normal fetal heart tones were documented prior to the procedure.  She was taken to the operating room and underwent spinal anesthesia without complication.  SCDs were on and operating.  She did not have any IV antibiotics prior to the procedure.  She was prepped and draped in normal sterile fashion and in the dorsal lithotomy position.  Asepto bulb was used to cleanse out the vagina.  The anterior and posterior lip of the cervix were grasped with the fenestrated ring forceps.  The vaginal reflection was identified and the stitch was placed a few millimeters caudad to that.  The 0 Prolene was looped on to fenestrated needle.  The first stitch was placed at 12 and came out above the 3 o'clock position.  The sutures were placed in a pursestring fashion around the whole cervix.  The knot was tied at 12 o'clock anteriorly.  There was minimal bleeding.  No leakage of fluid,  mucus or blood noted from the internal os.  The patient tolerated the procedure well.  All instrument, sponge, and needle counts were correct x3.  The patient was taken to the recovery room in stable condition.     Pieter PartridgeEvelyn B Ihor Meinzer, MD     EBV/MEDQ  D:  12/24/2016  T:  12/24/2016  Job:  [id number]

## 2017-02-07 ENCOUNTER — Ambulatory Visit (HOSPITAL_COMMUNITY)
Admission: RE | Admit: 2017-02-07 | Discharge: 2017-02-07 | Disposition: A | Discharge status: Home / Self Care | Payer: Managed Care, Other (non HMO) | Source: Ambulatory Visit | Attending: Obstetrics and Gynecology | Admitting: Obstetrics and Gynecology

## 2017-02-07 ENCOUNTER — Encounter (HOSPITAL_COMMUNITY): Payer: Self-pay

## 2017-02-07 ENCOUNTER — Other Ambulatory Visit: Payer: Self-pay | Admitting: Obstetrics and Gynecology

## 2017-02-07 DIAGNOSIS — O34219 Maternal care for unspecified type scar from previous cesarean delivery: Secondary | ICD-10-CM

## 2017-02-07 DIAGNOSIS — Z98891 History of uterine scar from previous surgery: Secondary | ICD-10-CM

## 2017-02-07 DIAGNOSIS — Z3A22 22 weeks gestation of pregnancy: Secondary | ICD-10-CM

## 2017-02-07 DIAGNOSIS — O3432 Maternal care for cervical incompetence, second trimester: Secondary | ICD-10-CM

## 2017-02-07 DIAGNOSIS — N883 Incompetence of cervix uteri: Secondary | ICD-10-CM

## 2017-02-07 DIAGNOSIS — O09219 Supervision of pregnancy with history of pre-term labor, unspecified trimester: Secondary | ICD-10-CM

## 2017-02-07 DIAGNOSIS — O4692 Antepartum hemorrhage, unspecified, second trimester: Secondary | ICD-10-CM | POA: Diagnosis not present

## 2017-02-07 DIAGNOSIS — O4592 Premature separation of placenta, unspecified, second trimester: Secondary | ICD-10-CM | POA: Diagnosis not present

## 2017-02-07 DIAGNOSIS — O09899 Supervision of other high risk pregnancies, unspecified trimester: Secondary | ICD-10-CM

## 2017-02-07 DIAGNOSIS — O09212 Supervision of pregnancy with history of pre-term labor, second trimester: Secondary | ICD-10-CM

## 2017-02-07 HISTORY — DX: Incompetence of cervix uteri: N88.3

## 2017-02-07 NOTE — Consult Note (Addendum)
MFM consult  31 yr old [id number] at [redacted]w[redacted]d with cervical insufficiency s/p history indicated cerclage and history of preterm delivery now with suspected short cervix referred by Dr. Dion Body for cervical length and consult.  Past OB hx: 25 week preterm delivery classical C section- had painless cervical dilation  No other significant medical history   Patient had history indicated cerclage placed at 14 weeks this pregnancy  Exam: cervix feels 3cm long; external os closed. Cerclage visualized and is in place approximately 1.5cm from the external cervical os.  Ultrasound today shows: single intrauterine pregnancy Posterior low lying placenta; the placental edge is 0.8cm from the internal cervical os. Normal amniotic fluid volume. Overall the total transvaginal cervical length is >4cm; there is a steep curve to the cervix. However, there is funneling seen down to the level of the cerclage. The cervix distal to the cerclage is difficult to visualized; therefore the closed portion of the cervix cannot be measured.  I counseled the patient as follows: 1. History of preterm delivery now with funneling of the cervix: - given exam, findings on ultrasound, and history of preterm delivery I discussed significant risk of PPROM, preterm labor, and preterm delivery - discussed options of another cerclage, vaginal progesterone, pessary, or surveillance - discussed risks of cerclage including bleeding, infection, rupture of membranes, and periviable delivery - discussed given her cerclage is in an acceptable position and there is funneling seen to the level of the cerclage I would recommend a different option as the first line as there are the risks above and she is at a periviable gestational age; but let her know this was an option if she preferred - discussed pessary and data that support reducing risk of preterm delivery; although discussed data is mixed; discussed no known adverse effects - discussed vaginal  progesterone and data that use in women with a short cervix have a 30-40% reduction in preterm delivery. Discussed felt to be relatively safe, but long term data is limited - After counseling patient would like to start vaginal progesterone; prescription given for prometrium  pv qhs - patient does not want to do the pessary or cerclage at this time; if there is further shortening patient would like to do pessary. I explained if there is no measurable cervix a pessary cannot be placed. - recommend repeat cervical length in 5 days - if further cervical shortening is noted recommend trial of a pessary - recommend pelvic restrictions and modified activity - recommend receive course of betamethasone at [redacted]w[redacted]d- discussed role in reducing morbidity and mortality in preterm infants- patient will come to [facility] on 4/14 and 4/15 for betamethasone - recommend NICU consult if patient desires 2. Low lying placenta: - recommend bleeding precautions - reevaluate placental location on follow up ultrasound 3. History of classical c section: - recommend delivery via repeat C section at 36-37 weeks or sooner if clinically indicated.  I spent a total of 60 minutes with the patient of which >50% was in face to face consultation.  Discussed the above with Dr. Jeronimo Greaves, MD

## 2017-02-08 ENCOUNTER — Other Ambulatory Visit (HOSPITAL_COMMUNITY): Payer: Self-pay | Admitting: *Deleted

## 2017-02-08 DIAGNOSIS — O09212 Supervision of pregnancy with history of pre-term labor, second trimester: Secondary | ICD-10-CM

## 2017-02-10 ENCOUNTER — Inpatient Hospital Stay (HOSPITAL_COMMUNITY)
Admission: EM | Admit: 2017-02-10 | Discharge: 2017-02-27 | DRG: 781 | Disposition: A | Discharge status: Home / Self Care | Payer: Managed Care, Other (non HMO) | Attending: Obstetrics and Gynecology | Admitting: Obstetrics and Gynecology

## 2017-02-10 ENCOUNTER — Inpatient Hospital Stay (HOSPITAL_COMMUNITY): Payer: Managed Care, Other (non HMO)

## 2017-02-10 ENCOUNTER — Encounter (HOSPITAL_COMMUNITY): Payer: Self-pay

## 2017-02-10 DIAGNOSIS — O4692 Antepartum hemorrhage, unspecified, second trimester: Secondary | ICD-10-CM | POA: Diagnosis present

## 2017-02-10 DIAGNOSIS — O322XX0 Maternal care for transverse and oblique lie, not applicable or unspecified: Secondary | ICD-10-CM | POA: Diagnosis present

## 2017-02-10 DIAGNOSIS — O99012 Anemia complicating pregnancy, second trimester: Secondary | ICD-10-CM | POA: Diagnosis present

## 2017-02-10 DIAGNOSIS — O09892 Supervision of other high risk pregnancies, second trimester: Secondary | ICD-10-CM

## 2017-02-10 DIAGNOSIS — D649 Anemia, unspecified: Secondary | ICD-10-CM | POA: Diagnosis present

## 2017-02-10 DIAGNOSIS — O09212 Supervision of pregnancy with history of pre-term labor, second trimester: Secondary | ICD-10-CM

## 2017-02-10 DIAGNOSIS — O34212 Maternal care for vertical scar from previous cesarean delivery: Secondary | ICD-10-CM | POA: Diagnosis present

## 2017-02-10 DIAGNOSIS — O4592 Premature separation of placenta, unspecified, second trimester: Principal | ICD-10-CM

## 2017-02-10 DIAGNOSIS — Z98891 History of uterine scar from previous surgery: Secondary | ICD-10-CM

## 2017-02-10 DIAGNOSIS — Z3A23 23 weeks gestation of pregnancy: Secondary | ICD-10-CM

## 2017-02-10 DIAGNOSIS — O3432 Maternal care for cervical incompetence, second trimester: Secondary | ICD-10-CM | POA: Diagnosis present

## 2017-02-10 DIAGNOSIS — N939 Abnormal uterine and vaginal bleeding, unspecified: Secondary | ICD-10-CM

## 2017-02-10 LAB — URINALYSIS, ROUTINE W REFLEX MICROSCOPIC
BILIRUBIN URINE: NEGATIVE
Bacteria, UA: NONE SEEN
GLUCOSE, UA: NEGATIVE mg/dL
Ketones, ur: NEGATIVE mg/dL
LEUKOCYTES UA: NEGATIVE
NITRITE: NEGATIVE
PROTEIN: NEGATIVE mg/dL
Specific Gravity, Urine: 1.009 (ref 1.005–1.030)
pH: 6 (ref 5.0–8.0)

## 2017-02-10 LAB — CBC
HEMATOCRIT: 35.1 % — AB (ref 36.0–46.0)
Hemoglobin: 12.2 g/dL (ref 12.0–15.0)
MCH: 29 pg (ref 26.0–34.0)
MCHC: 34.8 g/dL (ref 30.0–36.0)
MCV: 83.4 fL (ref 78.0–100.0)
Platelets: 261 10*3/uL (ref 150–400)
RBC: 4.21 MIL/uL (ref 3.87–5.11)
RDW: 14.3 % (ref 11.5–15.5)
WBC: 9.4 10*3/uL (ref 4.0–10.5)

## 2017-02-10 MED ORDER — MORPHINE SULFATE (PF) 4 MG/ML IV SOLN
4.0000 mg | INTRAVENOUS | Status: DC | PRN
  Administered 2017-02-10: 4 mg via INTRAVENOUS
  Filled 2017-02-10: qty 1

## 2017-02-10 MED ORDER — BETAMETHASONE SOD PHOS & ACET 6 (3-3) MG/ML IJ SUSP
12.0000 mg | Freq: Once | INTRAMUSCULAR | Status: AC
  Administered 2017-02-11: 12 mg via INTRAMUSCULAR
  Filled 2017-02-10: qty 2

## 2017-02-10 MED ORDER — PRENATAL MULTIVITAMIN CH
1.0000 | ORAL_TABLET | Freq: Every day | ORAL | Status: DC
  Administered 2017-02-11 – 2017-02-27 (×17): 1 via ORAL
  Filled 2017-02-10 (×17): qty 1

## 2017-02-10 MED ORDER — ZOLPIDEM TARTRATE 5 MG PO TABS
5.0000 mg | ORAL_TABLET | Freq: Every evening | ORAL | Status: DC | PRN
  Filled 2017-02-10: qty 1

## 2017-02-10 MED ORDER — SODIUM CHLORIDE 0.9 % IV BOLUS (SEPSIS)
1000.0000 mL | Freq: Once | INTRAVENOUS | Status: AC
  Administered 2017-02-10: 1000 mL via INTRAVENOUS

## 2017-02-10 MED ORDER — ACETAMINOPHEN 325 MG PO TABS
650.0000 mg | ORAL_TABLET | ORAL | Status: DC | PRN
  Administered 2017-02-18 – 2017-02-22 (×2): 650 mg via ORAL
  Filled 2017-02-10 (×2): qty 2

## 2017-02-10 MED ORDER — HYDROCODONE-ACETAMINOPHEN 5-325 MG PO TABS: 1.0000 | ORAL_TABLET | ORAL | Status: DC | PRN

## 2017-02-10 MED ORDER — DOCUSATE SODIUM 100 MG PO CAPS
100.0000 mg | ORAL_CAPSULE | Freq: Every day | ORAL | Status: DC
  Administered 2017-02-11 – 2017-02-27 (×10): 100 mg via ORAL
  Filled 2017-02-10 (×13): qty 1

## 2017-02-10 MED ORDER — CALCIUM CARBONATE ANTACID 500 MG PO CHEW
2.0000 | CHEWABLE_TABLET | ORAL | Status: DC | PRN
  Administered 2017-02-25: 400 mg via ORAL
  Filled 2017-02-10: qty 2

## 2017-02-10 MED ORDER — SODIUM CHLORIDE 0.9% FLUSH
3.0000 mL | Freq: Two times a day (BID) | INTRAVENOUS | Status: DC
  Administered 2017-02-11 – 2017-02-21 (×20): 3 mL via INTRAVENOUS

## 2017-02-10 MED ORDER — ONDANSETRON HCL 4 MG/2ML IJ SOLN
4.0000 mg | Freq: Once | INTRAMUSCULAR | Status: AC
  Administered 2017-02-10: 4 mg via INTRAVENOUS
  Filled 2017-02-10: qty 2

## 2017-02-10 MED ORDER — PROGESTERONE MICRONIZED 200 MG PO CAPS
200.0000 mg | ORAL_CAPSULE | Freq: Every day | ORAL | Status: DC
  Administered 2017-02-11 – 2017-02-26 (×17): 200 mg via VAGINAL
  Filled 2017-02-10 (×17): qty 1

## 2017-02-10 MED ORDER — BETAMETHASONE SOD PHOS & ACET 6 (3-3) MG/ML IJ SUSP
12.0000 mg | Freq: Once | INTRAMUSCULAR | Status: AC
  Administered 2017-02-10: 12 mg via INTRAMUSCULAR
  Filled 2017-02-10: qty 2

## 2017-02-10 NOTE — ED Notes (Signed)
OB rapid response notified. Currently on campus

## 2017-02-10 NOTE — ED Notes (Signed)
[facility] paged by [employer] operational officer for pt transport.

## 2017-02-10 NOTE — MAU Note (Signed)
Pt arrives from [facility] ED, presented there with some vaginal bleeding on tissue this pm. Denies pain at this time, but did have pain upon arrival to [facility] ED. Pt has a cerclage.

## 2017-02-10 NOTE — ED Notes (Signed)
Spoke with Dr. Normand Sloop n of pt hx current vaginal bleeding with cramping. S/P cerclage 12/12/16. Hx of 25 week delivery via c/S in 2015. Otherwise healthy pregnancy. Started feeling need to go to bathroom about 1800. Went to bathroom and was having bleeding and clots with right sided pain. Upon visuaL inspection pt had bright red blood on her pernium. [place] req if pt VS stable and baby looked good fro gestation pt to be traansferred to [facility] via [facility]. ED MD notified of need to transfer awaiting [facility].

## 2017-02-10 NOTE — ED Triage Notes (Signed)
Pt [redacted] weeks pregnant with hx of incompetent cervix presents with vaginal bleeding and moderate abdominal cramping that began approx 20 minutes ago. Pt reports previous emergency C-section for preterm birth at 25 weeks. Pt states she found a clot in her underwear about the size of a quarter. Pt states she feels her baby moving.

## 2017-02-10 NOTE — H&P (Signed)
History   CSN: [id number]  Arrival date and time: 02/10/17 1751   First Provider Initiated Contact with Patient 02/10/17 2043          Chief Complaint  Patient presents with  . Vaginal Bleeding    [redacted] weeks pregnant  . Abdominal Cramping   Kelsey Willis is a 31 y.o. G2P0101 at [redacted]w[redacted]d who presents today with vaginal bleeding. Pregnancy history is significant for prior PPROM and classical c-section at 25 weeks, low lying placenta, cervical cerclage. She states that around 1700 she started with dark red vaginal bleeding and passing clots. She has had intermittent abdominal pain. She had morphine at the [facility] ED. She reports that her pain has improved significantly with that medication.   Vaginal Bleeding  The patient's primary symptoms include vaginal bleeding. This is a new problem. The current episode started today. The problem occurs constantly. The problem has been unchanged. The pain is severe. The problem affects the right side. She is pregnant. The vaginal discharge was bloody. The vaginal bleeding is typical of menses. She has been passing clots. She has not been passing tissue. Nothing aggravates the symptoms. She has tried nothing for the symptoms.        Past Medical History:  Diagnosis Date  . Headache    have noticed since pregnancy  . Incompetent cervix   . Medical history non-contributory          Past Surgical History:  Procedure Laterality Date  . CERVICAL CERCLAGE N/A 12/12/2016   Procedure: CERCLAGE CERVICAL;  Surgeon: Geryl Rankins, MD;  Location: [facility];  Service: Gynecology;  Laterality: N/A;  . CESAREAN SECTION    . WISDOM TOOTH EXTRACTION           Family History  Problem Relation Age of Onset  . Cancer Mother   . Asthma Mother   . Heart disease Father   . Cancer Maternal Grandmother   . Cancer Paternal Grandmother   . Diabetes Paternal Grandfather   . Heart disease Paternal Grandfather           Social History   Substance Use Topics  . Smoking status: Never Smoker  . Smokeless tobacco: Never Used  . Alcohol use No     Comment: none since pregnancy    Allergies: No Known Allergies         Prescriptions Prior to Admission  Medication Sig Dispense Refill Last Dose  . acetaminophen (TYLENOL 8 HOUR) 650 MG CR tablet Take 1 tablet (650 mg total) by mouth every 8 (eight) hours as needed for pain. 30 tablet 0 Past Week at Unknown time  . Prenatal Vit-Fe Fumarate-FA (PRENATAL MULTIVITAMIN) TABS tablet Take 1 tablet by mouth daily at 12 noon.   02/10/2017 at Unknown time    Review of Systems  Genitourinary: Positive for vaginal bleeding.   Physical Exam   Blood pressure 131/78, pulse 64, temperature 98.1 F (36.7 C), temperature source Oral, resp. rate 16, height  (1.702 m), weight 92.1 kg (203 lb), SpO2 100 %.  Physical Exam  Nursing note and vitals reviewed. Constitutional: She is oriented to person, place, and time. She appears well-developed and well-nourished. No distress.  HENT:  Head: Normocephalic.  Cardiovascular: Normal rate.   Respiratory: Effort normal.  GI: Soft. There is no tenderness. There is no rebound.  Genitourinary:  Genitourinary Comments: External: no lesion Vagina: small amount of bright red blood seen Cervix: pink, smooth, visually closed.  Uterus:AGA    Neurological: She is  alert and oriented to person, place, and time.  Skin: Skin is warm and dry.  Psychiatric: She has a normal mood and affect.   FHT: 135, moderate with 10x10 accels, no decels Toco: no UCs  MAU Course  Procedures  MDM  Note from MFM patient was supposed to have BMZ on 4/14 and 4/15. Patient states that she was not aware of this plan. Will give first dose of BMZ series today.   2109: Dr. Normand Sloop called the unit. She states that patient can have an [place] then MAU staff can call Kelsey Willis, CNM  Kelsey Willis  9:23 PM 02/10/17    ADDENDUM: In to see pt to  obtain additional information regarding bleeding. NAD, eating hamburger. Reports pain in upper right abdomen, tender to palpation, rates pain 4-5/10 after IV Morphine Sulfate, now 4/10. States "13" without pain meds. Reports seeing thick clots today after wiping. Has not changed pad (1/3 of pad saturated w/ dried, old blood). States self treated yeast infection 2 wks ago w/ OTC Monistat and is using nightly 17P. States no intercourse since cerclage placed. Endorses FM. Denies regular ctxs or LOF. U/S preliminary report = heterogeneous area extending across superior/anterior margin of placenta 2.2 x 1.3 x 6.2 cm suspicious for marginal abruption. Placenta does not appear to be low lying. Cervical length 4.1 cm, cerclage visualized. FHRT: BL 135, moderate variability, +accels, no decels; reassuring for this gestational age. TOCO: No ctxs, none to palpation. Cervix checked after u/s per Dr. Redmond Baseman instructions & noted to be long/closed. Dark red clots noted at introitus. Hbg 12.2, plts 261. Admit per Dr. Normand Sloop. CBC, T&S, MFM consult, BMZ #2 on 02/11/17. Will continue vaginal progesterone per Dr. Dion Body.  Po hydration Pad count Regular diet Continuous toco  FHTs q shift Dr. Dion Body to assume care at 07:00 on 02/11/17   Kelsey Willis, CNM 02/10/17, 11:44 PM

## 2017-02-10 NOTE — ED Notes (Addendum)
Dr. Fayrene Fearing at the bedside, fetal heart beat found on [place]. OB rapid response to hook pt up to fetal monitor on arrival. Will continue to monitor pt.

## 2017-02-10 NOTE — ED Provider Notes (Signed)
[facility]-EMERGENCY DEPT Provider Note   CSN: [id number] Arrival date & time: 02/10/17  1751     History   Chief Complaint Chief Complaint  Patient presents with  . Vaginal Bleeding    [redacted] weeks pregnant  . Abdominal Cramping    HPI Kelsey Willis is a 31 y.o. female.  Patient who is [redacted] weeks pregnant as of yesterday, presents with vaginal bleeding that began minutes prior to arrival. She also experienced some abdominal cramping at the time. States that there were clots present. She had a cerclage done about one month ago. Also began taking vaginal progesterone since yesterday. Patient follows up regularly with [facility]. Denies any appetite changes and states she eats and drinks well. She denies chest pain, trouble breathing, leg swelling, hemoptysis, urinary symptoms, nausea, vomiting, bowel changes, lightheadedness. Of note patient's last baby was delivered at 25 weeks due to incompetent cervix. No vaginal bleeding during that pregnancy.      Past Medical History:  Diagnosis Date  . Headache    have noticed since pregnancy  . Incompetent cervix   . Medical history non-contributory     Patient Active Problem List   Diagnosis Date Noted  . BV (bacterial vaginosis) 12/16/2015    Past Surgical History:  Procedure Laterality Date  . CERVICAL CERCLAGE N/A 12/12/2016   Procedure: CERCLAGE CERVICAL;  Surgeon: Geryl Rankins, MD;  Location: [facility];  Service: Gynecology;  Laterality: N/A;  . CESAREAN SECTION    . WISDOM TOOTH EXTRACTION      OB History    Gravida Para Term Preterm AB Living   2 1 0 1 0 1   SAB TAB Ectopic Multiple Live Births   0 0 0 0 1       Home Medications    Prior to Admission medications   Medication Sig Start Date End Date Taking? Authorizing Provider  acetaminophen (TYLENOL 8 HOUR) 650 MG CR tablet Take 1 tablet (650 mg total) by mouth every 8 (eight) hours as needed for pain. 12/12/16   Geryl Rankins, MD  Prenatal Vit-Fe Fumarate-FA  (PRENATAL MULTIVITAMIN) TABS tablet Take 1 tablet by mouth daily at 12 noon.    Historical Provider, MD    Family History Family History  Problem Relation Age of Onset  . Cancer Mother   . Asthma Mother   . Heart disease Father   . Cancer Maternal Grandmother   . Cancer Paternal Grandmother   . Diabetes Paternal Grandfather   . Heart disease Paternal Grandfather     Social History Social History  Substance Use Topics  . Smoking status: Never Smoker  . Smokeless tobacco: Never Used  . Alcohol use No     Comment: none since pregnancy     Allergies   Patient has no known allergies.   Review of Systems Review of Systems  Constitutional: Negative for appetite change, chills and fever.  HENT: Negative for ear pain, rhinorrhea, sneezing and sore throat.   Eyes: Negative for photophobia and visual disturbance.  Respiratory: Negative for cough, chest tightness, shortness of breath and wheezing.   Cardiovascular: Negative for chest pain and palpitations.  Gastrointestinal: Positive for abdominal pain. Negative for blood in stool, constipation, diarrhea, nausea and vomiting.  Genitourinary: Positive for vaginal bleeding. Negative for dysuria, hematuria, urgency and vaginal discharge.  Musculoskeletal: Negative for back pain and myalgias.  Skin: Negative for rash.  Neurological: Negative for dizziness, syncope, weakness, light-headedness and headaches.     Physical Exam Updated Vital Signs  BP 122/82   Pulse 70   Temp 98.3 F (36.8 C) (Oral)   Resp 16   Ht  (1.702 m)   Wt 92.1 kg   LMP  (LMP Unknown)   SpO2 100%   BMI 31.79 kg/m   Physical Exam  Constitutional: She appears well-developed and well-nourished. No distress.  Tearful.  HENT:  Head: Normocephalic and atraumatic.  Nose: Nose normal.  Eyes: Conjunctivae and EOM are normal. Right eye exhibits no discharge. Left eye exhibits no discharge. No scleral icterus.  Neck: Normal range of motion. Neck supple.    Cardiovascular: Normal rate, regular rhythm, normal heart sounds and intact distal pulses.  Exam reveals no gallop and no friction rub.   No murmur heard. Pulmonary/Chest: Effort normal and breath sounds normal. No respiratory distress.  Abdominal: Soft. Bowel sounds are normal. She exhibits no distension. There is no tenderness. There is no guarding.  Musculoskeletal: Normal range of motion. She exhibits no edema.  Neurological: She is alert. She exhibits normal muscle tone. Coordination normal.  Skin: Skin is warm and dry. No rash noted.  Psychiatric: She has a normal mood and affect.  Nursing note and vitals reviewed.    ED Treatments / Results  Labs (all labs ordered are listed, but only abnormal results are displayed) Labs Reviewed - No data to display  EKG  EKG Interpretation None       Radiology No results found.  Procedures Procedures (including critical care time)  Medications Ordered in ED Medications - No data to display   Initial Impression / Assessment and Plan / ED Course  I have reviewed the triage vital signs and the nursing notes.  Pertinent labs & imaging results that were available during my care of the patient were reviewed by me and considered in my medical decision making (see chart for details).     Patient's history and symptoms concerning for second trimester vaginal bleeding. She does endorse good fetal movement. Patient denies any other complaints at this time. Patient has a history of incompetent cervix in the past. Bedside ultrasound done by Dr. Fayrene Fearing showed good fetal movement and fetal heart tones. Rapid OB response was called. Patient will be transferred to [facility] for further care.  Final Clinical Impressions(s) / ED Diagnoses   Final diagnoses:  Vaginal bleeding    New Prescriptions New Prescriptions   No medications on file     Brooks Sailors 02/10/17 1903    Rolland Porter, MD 02/10/17 2251

## 2017-02-10 NOTE — MAU Provider Note (Signed)
History     CSN: [id number]  Arrival date and time: 02/10/17 1751   First Provider Initiated Contact with Patient 02/10/17 2043      Chief Complaint  Patient presents with  . Vaginal Bleeding    [redacted] weeks pregnant  . Abdominal Cramping   Kelsey Willis is a 31 y.o. G2P0101 at [redacted]w[redacted]d who presents today with vaginal bleeding. Pregnancy history is significant for prior PPROM and classical c-section at 25 weeks, low lying placenta, cervical cerclage. She states that around 1700 she started with dark red vaginal bleeding and passing clots. She has had intermittent abdominal pain. She had morphine at the [facility] ED. She reports that her pain has improved significantly with that medication.    Vaginal Bleeding  The patient's primary symptoms include vaginal bleeding. This is a new problem. The current episode started today. The problem occurs constantly. The problem has been unchanged. The pain is severe. The problem affects the right side. She is pregnant. The vaginal discharge was bloody. The vaginal bleeding is typical of menses. She has been passing clots. She has not been passing tissue. Nothing aggravates the symptoms. She has tried nothing for the symptoms.    Past Medical History:  Diagnosis Date  . Headache    have noticed since pregnancy  . Incompetent cervix   . Medical history non-contributory     Past Surgical History:  Procedure Laterality Date  . CERVICAL CERCLAGE N/A 12/12/2016   Procedure: CERCLAGE CERVICAL;  Surgeon: Kelsey Rankins, MD;  Location: [facility] ORS;  Service: Gynecology;  Laterality: N/A;  . CESAREAN SECTION    . WISDOM TOOTH EXTRACTION      Family History  Problem Relation Age of Onset  . Cancer Mother   . Asthma Mother   . Heart disease Father   . Cancer Maternal Grandmother   . Cancer Paternal Grandmother   . Diabetes Paternal Grandfather   . Heart disease Paternal Grandfather     Social History  Substance Use Topics  . Smoking status: Never  Smoker  . Smokeless tobacco: Never Used  . Alcohol use No     Comment: none since pregnancy    Allergies: No Known Allergies  Prescriptions Prior to Admission  Medication Sig Dispense Refill Last Dose  . acetaminophen (TYLENOL 8 HOUR) 650 MG CR tablet Take 1 tablet (650 mg total) by mouth every 8 (eight) hours as needed for pain. 30 tablet 0 Past Week at Unknown time  . Prenatal Vit-Fe Fumarate-FA (PRENATAL MULTIVITAMIN) TABS tablet Take 1 tablet by mouth daily at 12 noon.   02/10/2017 at Unknown time    Review of Systems  Genitourinary: Positive for vaginal bleeding.   Physical Exam   Blood pressure 131/78, pulse 64, temperature 98.1 F (36.7 C), temperature source Oral, resp. rate 16, height  (1.702 m), weight 92.1 kg (203 lb), SpO2 100 %.  Physical Exam  Nursing note and vitals reviewed. Constitutional: She is oriented to person, place, and time. She appears well-developed and well-nourished. No distress.  HENT:  Head: Normocephalic.  Cardiovascular: Normal rate.   Respiratory: Effort normal.  GI: Soft. There is no tenderness. There is no rebound.  Genitourinary:  Genitourinary Comments: External: no lesion Vagina: small amount of bright red blood seen Cervix: pink, smooth, visually closed.  Uterus:AGA    Neurological: She is alert and oriented to person, place, and time.  Skin: Skin is warm and dry.  Psychiatric: She has a normal mood and affect.   FHT: 135,  moderate with 10x10 accels, no decels Toco: no UCs  MAU Course  Procedures  MDM  Note from MFM patient was supposed to have BMZ on 4/14 and 4/15. Patient states that she was not aware of this plan. Will give first dose of BMZ series today.   2109: Dr. Normand Willis called the unit. She states that patient can have an [place] then MAU staff can call Kelsey Willis, CNM  Kelsey Willis  9:23 PM 02/10/17   Assessment and Plan

## 2017-02-10 NOTE — ED Notes (Signed)
called for pt transfer w/[facility]

## 2017-02-10 NOTE — ED Notes (Signed)
ED Provider at bedside. 

## 2017-02-10 NOTE — ED Notes (Signed)
Per OB nurse, Dr. Normand Sloop feels pt stable enough to be transported to [facility] for further care. Dr. Fayrene Fearing aware.

## 2017-02-10 NOTE — ED Notes (Signed)
OB rapid response at the bedside.

## 2017-02-11 ENCOUNTER — Inpatient Hospital Stay (HOSPITAL_COMMUNITY): Payer: Managed Care, Other (non HMO)

## 2017-02-11 LAB — CBC
HEMATOCRIT: 33.9 % — AB (ref 36.0–46.0)
Hemoglobin: 11.8 g/dL — ABNORMAL LOW (ref 12.0–15.0)
MCH: 28.4 pg (ref 26.0–34.0)
MCHC: 34.8 g/dL (ref 30.0–36.0)
MCV: 81.7 fL (ref 78.0–100.0)
Platelets: 267 10*3/uL (ref 150–400)
RBC: 4.15 MIL/uL (ref 3.87–5.11)
RDW: 13.6 % (ref 11.5–15.5)
WBC: 9.2 10*3/uL (ref 4.0–10.5)

## 2017-02-11 LAB — TYPE AND SCREEN
ABO/RH(D): B POS
ANTIBODY SCREEN: NEGATIVE

## 2017-02-11 NOTE — Progress Notes (Addendum)
Kelsey Willis is a 31 y.o. G2P0101 at [redacted]w[redacted]d HD #2  Subjective: Pt states she feels much better this am. Having low RLQ pain.  Bleeding is scant.  Denies contractions.  No LOF.  +FM.  Pt is tearful b/c she does not what is going on. Pt and her husband would not like any intervention to save fetus until 25 weeks.  NICU and MFM consult pending.  Objective: BP 119/65 (BP Location: Left Arm)   Pulse 79   Temp 98.2 F (36.8 C) (Oral)   Resp 18   Ht  (1.702 m)   Wt 92.1 kg (203 lb)   LMP  (LMP Unknown)   SpO2 98%   BMI 31.79 kg/m  No intake/output data recorded. No intake/output data recorded.  Gen:  NAD, tearful. Abd:  Soft, nontender, no rebound Ext:  No calf tenderness. FHT:  140s by Doppler UC:   none SVE:   Dilation: Closed Exam by:: Kelsey Willis CNM  Labs: Lab Results  Component Value Date   WBC 9.2 02/11/2017   HGB 11.8 (L) 02/11/2017   HCT 33.9 (L) 02/11/2017   MCV 81.7 02/11/2017   PLT 267 02/11/2017    Assessment / Plan: IUP  3/7 weeks. Second trimester vaginal bleeding not due to abruption per MFM.  Subchorionic hemorrhage..  Bleeding has resolved.  H/o IC with cerclage in place.  Cervical length 4 cm 2 days ago.  Reassess with MFM today. Right lower quadrant pain resumed.  Fetal Wellbeing:  Reassuring fetal status. Doppler q 4 hours.No intervention until 25 weeks including classical c-section.  MFM consult for recommendations on further management today. NICU consult bending. Continue Inpatient Bedrest. Readdress pt's plans for intervention once consults completed. TED hose.  Kelsey Willis, Kelsey Willis

## 2017-02-11 NOTE — Consult Note (Signed)
I reviewed the patient's clinical status and labs and read the [place] from yesterday. There is no evidence of abruption or fetal compromise. At this time I have no interventions to offer and would recommend routine prenatal care with pelvic rest for 2-4 weeks

## 2017-02-12 ENCOUNTER — Encounter (HOSPITAL_COMMUNITY): Payer: Self-pay

## 2017-02-12 ENCOUNTER — Inpatient Hospital Stay (HOSPITAL_COMMUNITY): Payer: Managed Care, Other (non HMO)

## 2017-02-12 ENCOUNTER — Ambulatory Visit (HOSPITAL_COMMUNITY): Payer: BLUE CROSS/BLUE SHIELD

## 2017-02-12 MED ORDER — NIFEDIPINE 10 MG PO CAPS
10.0000 mg | ORAL_CAPSULE | Freq: Four times a day (QID) | ORAL | Status: DC
  Administered 2017-02-12 – 2017-02-20 (×32): 10 mg via ORAL
  Filled 2017-02-12 (×33): qty 1

## 2017-02-12 NOTE — Progress Notes (Signed)
Patient to MFM for ultrasound.

## 2017-02-12 NOTE — Consult Note (Signed)
Neonatology Consult to Antenatal Patient: 02/12/2017 5:51 PM    I was requested by Dr Dion Body to see this patient in order to provide antenatal counseling due to cervical insufficiency and vaginal bleeding at 23 3/[redacted] weeks gestation.    Kelsey Willis is a  31 y/o G2P0101 who was admitted on 4/15 and is now 23 3/[redacted] weeks GA.  She came in with vaginal bleeding which is now resolved and abruption ruled out as well as cervical insufficiency S/P history of cerclage and preterm delivery at [redacted] weeks gestation.  She is currently "not" having active labor.  She received a course of BMZ on 4/15 and 4/16 and is just on Prometrium(vaginal Progesterone).  Most recent ultrasound today showed an AFI of 4.7 and EFW 591 grams.  I spoke with Kelsey Willis in Room 318.   We discussed in detail what to expect in case of possible delivery of the infant in the next few days including poor morbidity and mortality at this gestational age, usual delivery room resuscitation including intubation and surfactant administration in the DR.  Discussed possible respiratory complications and need for support including mechanical ventilation, IV access, sepsis work-up, NG/OG feedings ( benefits of BF if MOB desires breast feeding and availability of Donor Breast Milk), risk for IVH with the potential for motor/cognitive deficits, length of stay and long-term outcome.  Kelsey Willis was attentive, had appropriate questions, and expressed appreciation for my input.    Kelsey Willis informed me that based on their experience with her first pregnancy (born [redacted] weeks gestation last March 29, 2014 at [facility] ) they are well aware of all the possible complications.  She said her almost 62 y/o son is doing well with no complications or delay and they believe it is a miracle. This is the reason why she and her husband have decided that with this present pregnancy they  "Do Not Want" infant to be resuscitated if he was born before [redacted] weeks gestation.  I  informed Kelsey Willis that in this institution we start resuscitating infants born at [redacted] weeks gestation.   It is not only based on how many weeks an infant is born but the Neonatologists (who is present 24 hours a day) will evaluate infant's viability right after delivery.   Kelsey Willis EDC was based on an ultrasound and not her LMP and infant's EFW from today's ultrasound was 591 grams already.   I told her that in this situation we take in consideration the parents wishes and the Neonatologist who will attend the delivery will determine viability and inform the parents.   Since she is not in active labor at present time it is probably unlikely that delivery is imminent but will pass on her request to the Neonatology team.   Thank you for asking [place] to see this patient and allowing [place] to participate in her care.  Please call if there are any further questions.   Overton Mam, MD (Attending Neonatologist)  Total length of face-to-face or floor/unit time for this encounter was 45 minutes. Counseling and/or coordination of care was greater than fifty percent of the time above.

## 2017-02-13 NOTE — Progress Notes (Addendum)
In to check on pt. No changes. No bleeding or pain.  Pt appears comfortable. FOB and family at bedside.  All questions answered. Continue current plan. Dr. Charlotta Newton covering tomorrow for me starting at 7 am. Pt informed.  Addendum-T&S q 3 days starting tomorrow.

## 2017-02-13 NOTE — Progress Notes (Addendum)
Kelsey Willis is a 31 y.o. G2P0101 at [redacted]w[redacted]d HD #3  Subjective: Pt states she feels much better today now that she has plan and has gotten answers. RLQ pain has resolved. Not having bleeding or LOF.  Tolerating Procardia. She feels she had a good discussion with the neonatologist. Prior to their discussion, she did not want any intervention prior to 25 weeks.  She understands that if there are signs of viability, that they are required by law to resuscitate the baby.  Pt states she is OK with that.  We discussed doing a repeat c-section to improve outcomes for the baby if resuscitation was indicated.  Pt was agreeable to that.  She would not want the baby to suffer.  Objective: BP 111/66 (BP Location: Right Arm)   Pulse 90   Temp 98.7 F (37.1 C) (Oral)   Resp 18   Ht  (1.702 m)   Wt 92.1 kg (203 lb)   LMP  (LMP Unknown)   SpO2 99%   BMI 31.79 kg/m  No intake/output data recorded. No intake/output data recorded.  Gen:  NAD Abd:  Soft, nontender, no rebound Ext:  No calf tenderness. FHT:  145s by Doppler UC:   none SVE:   Dilation: Closed Exam by:: Caryl Never CNM  Labs: Lab Results  Component Value Date   WBC 9.2 02/11/2017   HGB 11.8 (L) 02/11/2017   HCT 33.9 (L) 02/11/2017   MCV 81.7 02/11/2017   PLT 267 02/11/2017   Ultrasound shows cervical length of 3.4 cm with funneling to the level of the cerclage.  Breech presentation.  Assessment / Plan: IUP  4/7 weeks. Second trimester vaginal bleeding, subchorionic hemorrhage. No bleeding currently. H/o IC with cerclage in place. Funneling noted. RLQ pain may have been due to uterine activity.  Procardia started. Fetal Wellbeing:  Reassuring fetal status. Dopplers q .  Continue Inpatient Bedrest.  Toco prn pain or signs of contractions. Continue Procardia 10 mg q 6 hours. MFM following.  Will schedule f/u for 1 week. TED hose for DVT prophylaxis. After discussion with pt, will do repeat csection if  labor unless clinically suspect that baby will deliver quickly vaginally, ie-parts in the vagina, until 25 weeks.  Dion Body, Lucylle Foulkes

## 2017-02-14 NOTE — Progress Notes (Addendum)
Kelsey Willis is a 31 y.o. G2P0101 at [redacted]w[redacted]d HD #5  Subjective: Pt reports lower abdominal discomfort.  She was given prune juice last night b/c she has not had a BM in 2 days.  RN states she put her on the tocometer when pt complained and no contractions noted.  Pt states pain resolved in a a few hours.  She has not had a BM.  No LOF, no vaginal bleeding, +FM. No bleeding. Pt state she wore TED hose overnight.    Objective: BP 111/60 (BP Location: Right Arm)   Pulse 74   Temp 98.6 F (37 C) (Oral)   Resp 16   Ht  (1.702 m)   Wt 203 lb (92.1 kg)   LMP  (LMP Unknown)   SpO2 100%   BMI 31.79 kg/m  No intake/output data recorded. No intake/output data recorded.  Gen:  NAD Abd:  Soft, gravid, nontender, no rebound Ext:  No calf tenderness. No edema.  TED hose not on. FHT:  140s by doppler UC:   None SVE: Deferred  Labs: Lab Results  Component Value Date   WBC 9.2 02/11/2017   HGB 11.8 (L) 02/11/2017   HCT 33.9 (L) 02/11/2017   MCV 81.7 02/11/2017   PLT 267 02/11/2017     Assessment / Plan: IUP  6/7 weeks. Second trimester vaginal bleeding, subchorionic hemorrhage. No bleeding currently. H/o Cervical insufficiency with cerclage in place. Funneling noted. Breech presentation Fetal Wellbeing:  Reassuring fetal status. Dopplers q shift H/o Classical c-section  Continue Inpatient Bedrest.  Toco prn pain or signs of contractions. Start Miralax once daily. Continue Procardia 10 mg q 6 hours and Vaginal progesterone once nightly. MFM following . F/u on 02/19/17. TED hose for DVT prophylaxis.  If pt shows evidence labor, will for repeat csection unless clinically suspect that baby will deliver quickly vaginally, ie-parts in the vagina, until 25 weeks.   [facility] covering this weekend.  Pt informed.  Lucky Rathke, MD

## 2017-02-15 LAB — TYPE AND SCREEN
ABO/RH(D): B POS
ANTIBODY SCREEN: NEGATIVE

## 2017-02-15 MED ORDER — POLYETHYLENE GLYCOL 3350 17 G PO PACK
17.0000 g | PACK | Freq: Every day | ORAL | Status: DC
  Administered 2017-02-15 – 2017-02-27 (×5): 17 g via ORAL
  Filled 2017-02-15 (×7): qty 1

## 2017-02-15 NOTE — Progress Notes (Signed)
Pt c/o lower abdominal cramping & pressure that began this morning.  EFM to be applied for further eval.

## 2017-02-16 NOTE — Progress Notes (Addendum)
Hospital day # 6 pregnancy at [redacted]w[redacted]d  S: well, reports good fetal activity      Contractions:none      Vaginal bleeding:none now and spotting Earlier this am      Vaginal discharge: no significant change  O: BP (!) 109/49 (BP Location: Right Arm)   Pulse 79   Temp 98.2 F (36.8 C) (Oral)   Resp 16   Ht  (1.702 m)   Wt 203 lb (92.1 kg)   LMP  (LMP Unknown)   SpO2 98%   BMI 31.79 kg/m       Fetal tracings:reviewed and reassuring      Uterus gravid and non-tender      Extremities: extremities normal, atraumatic, no cyanosis or edema and no significant edema and no signs of DVT  A: [redacted]w[redacted]d with cervical insuffiency     stable  P: continue current plan of care  Lawson Fiscal A Clemmons  02/16/2017 9:53 AM CNM  ADDENDUM:  I saw and examined patient at bedside and agree with above findings, assessment and plan as outlined by Cathie Beams.   31 year old G1P0101 @ [redacted] weeks EGA with history of cervical incompetence and classical c/section, with prophylactic cerclage placed in this pregnancy, who presented with vaginal bleeding, found to have a subchorionic hemorrhage and cervix 3.1 cm with funnelling to cerclage, stable maternal and fetal status, s/p [id number]  Continue with current care.  Dr. Sallye Ober.

## 2017-02-16 NOTE — Progress Notes (Signed)
Patient called out with c/o bleeding. Slight spotting noted on toilet paper which patient placed on bathroom sink. Peripad placed on patient. Pt c/o abdominal discomfort. FHM and TOCO applied. Continue to monitor.

## 2017-02-17 NOTE — Progress Notes (Signed)
Hospital day # 7 pregnancy at [redacted]w[redacted]d  S: well, reports good fetal activity      Contractions:none      Vaginal bleeding:none now       Vaginal discharge: no significant change  O: BP 119/66   Pulse (!) 101   Temp 98.7 F (37.1 C) (Oral)   Resp 18   Ht  (1.702 m)   Wt 203 lb (92.1 kg)   LMP  (LMP Unknown)   SpO2 100%   BMI 31.79 kg/m       Fetal tracings:reviewed and reassuring      Uterus gravid and non-tender      Extremities: extremities normal, atraumatic, no cyanosis or edema and no significant edema and no signs of DVT  A: [redacted]w[redacted]d with cervical insuffiency     stable  P: continue current plan of care  Lawson Fiscal A Ralphael Southgate  CNM 02/17/2017 1:10 PM

## 2017-02-18 ENCOUNTER — Inpatient Hospital Stay (HOSPITAL_COMMUNITY): Payer: Managed Care, Other (non HMO)

## 2017-02-18 LAB — TYPE AND SCREEN
ABO/RH(D): B POS
Antibody Screen: NEGATIVE

## 2017-02-18 MED ORDER — LACTATED RINGERS IV BOLUS (SEPSIS)
1000.0000 mL | Freq: Once | INTRAVENOUS | Status: AC
  Administered 2017-02-18: 1000 mL via INTRAVENOUS

## 2017-02-18 MED ORDER — NIFEDIPINE 10 MG PO CAPS
10.0000 mg | ORAL_CAPSULE | Freq: Once | ORAL | Status: AC
  Administered 2017-02-18: 10 mg via ORAL

## 2017-02-18 MED ORDER — LACTATED RINGERS IV SOLN
INTRAVENOUS | Status: DC
  Administered 2017-02-20 – 2017-02-21 (×3): via INTRAVENOUS

## 2017-02-18 NOTE — Consult Note (Signed)
MFM Note  Please see previous MFM note regarding h/o preterm delivery, cervical length and cerclage (placed at 14+ weeks)  Kelsey Willis was admitted on 04/15 for vaginal bleeding. She felt urge to void and saw bright red blood with clots in toilet. Also c/o mild mentrual-like cramps. The bleeding gradually slowed and stopped on HD #4. Then this AM, the bleeding started again but heavier than before. Pt and fetus are stable without signs or symptoms of distress.  [place] on admission revealed a subchorionic hemorrhage distal from placenta. Appropriate fetal growth. The cervix had a normal length without funneling. Cerclage in place without tension on suture.  Today on exam (performed by Dr. Idamae Schuller), blood and small clots were visualized exiting the cervical os. No blood seen coming from cerclage suture sites.  On monitor: normal variability for gestational age; no decelerations; no contractions picking up  OB history: painless dilation of cervix at 25 weeks; contractions of monitor which failed tocolysis attempt; delivered by classical C/S; son alive and well without any prematurity sequelae  Assessment: 1) SIUP at 24+2 weeks 2) Placental abruption with active bleeding a week ago and again today 3) History suspicious for cervical insufficiency with prophylactic cerclage placed at 14 weeks 4) H/O preterm delivery at 25 weeks via classical C/S  Plan: - Continue to monitor amount of vaginal bleeding - Hopefully it will slow and stop over the next few days - Continue in house observation/monitoring - Pt told she could completely abrupt at any time or continue to bleed necessitating blood transfusions and/or another classical C/S - Day by day assessments - Check H/H periodically - may want to add Fe if she can tolerate it - If no bleeding for 7-10 days, consider outpt management - Courses of BMZ if not given so far  (Face-to-face consultation with patient: 30 min) - Keep active T&S

## 2017-02-18 NOTE — Progress Notes (Signed)
MD made aware that patient had a VB episode when up to the bathroom. 3 small clots the size of a nickel per patient and "lots of blood in the toilet" per patient. Peri pad on the patient noted for the past 15 minutes has scant amount of red blood. No active vaginal bleeding noted from perineum. Per MD, after reassurring FHR tracing for 20 minutes, cardio may be d/c'd and continue toco. MFM to consult per orders.

## 2017-02-18 NOTE — Progress Notes (Signed)
Initial Nutrition Assessment  DOCUMENTATION CODES:   Not applicable  INTERVENTION:  Regular Diet May order double protein portions, snacks TID and from retail  NUTRITION DIAGNOSIS:   Increased nutrient needs related to  (pregnancy and fetal growth requirements) as evidenced by  (24 2/7 weeks IUP).   GOAL:  Patient will meet greater than or equal to 90% of their needs  MONITOR:  Weight trends  REASON FOR ASSESSMENT:  Antenatal    ASSESSMENT:  24 2/7 weeks, vag bleed. Weight on 12/12/16 185 lbs BMI 29. Weight gain 18 lbs. diet currently on hold  due to recent vag bleed. appetite good prior Diet Order:  Diet regular Room service appropriate? Yes; Fluid consistency: Thin  Skin:  Reviewed, no issues  Height:   Ht Readings from Last 1 Encounters:  02/10/17  (1.702 m)    Weight:   Wt Readings from Last 1 Encounters:  02/10/17 203 lb (92.1 kg)    Ideal Body Weight:     BMI:  Body mass index is 31.79 kg/m.  Estimated Nutritional Needs:   Kcal:  2100-2300  Protein:  93-103 g  Fluid:  2.4 L  EDUCATION NEEDS:   No education needs identified at this time  Inez Pilgrim.Odis Luster LDN Neonatal Nutrition Support Specialist/RD III Pager [phone]      Phone [phone]

## 2017-02-18 NOTE — Progress Notes (Signed)
After pt voided up pinkish mucus scant amt on toilet paper, nothing on her pad.  Pt reports good fetal movement denies cramping or any abd discomfort.  Will continue to monitor

## 2017-02-18 NOTE — Progress Notes (Addendum)
Kelsey Willis is a 31 y.o. G2P0101 at [redacted]w[redacted]d HD #8  Subjective: Pt noticed spotting this morning.  A few hours later she passed large clots, more than on admission.  She started feeling intermittent lower abdominal pain which resolved after Procardia and IVF bolus.  Denies LOF.  +FM Per RN, pt got her Procardia @ 6, 8:30 am and 12 PM.   Objective: BP 104/63   Pulse 78   Temp 97.9 F (36.6 C) (Oral)   Resp 18   Ht  (1.702 m)   Wt 92.1 kg (203 lb)   LMP  (LMP Unknown)   SpO2 100%   BMI 31.79 kg/m  I/O last 3 completed shifts: In: 3 [I.V.:3] Out: -  No intake/output data recorded.  Gen:  NAD Abd:  Soft, gravid, nontender, no rebound Ext:  No calf tenderness. TED hose in place. FHT:  140s by NST, no declerations. UC:   irritability SVE: Cervix appears thick, closed.  Long dark clot x 2 at os.  Once removed, no active bleeding.  No pooling of blood.  Cerclage at 12 o'clock, intact, no bleeding at knot.  Labs: Lab Results  Component Value Date   WBC 9.2 02/11/2017   HGB 11.8 (L) 02/11/2017   HCT 33.9 (L) 02/11/2017   MCV 81.7 02/11/2017   PLT 267 02/11/2017     Assessment / Plan: IUP  2/7 weeks. Second trimester vaginal bleeding, h/o subchorionic hemorrhage. Bleeding resolving-appears old.  Bleeding may be  associated with contractions. H/o Cervical insufficiency with cerclage in place. Funneling noted. Breech presentation 1 week ago. Fetal Wellbeing:  Reassuring fetal status. Dopplers q shift H/o Classical c-section Addendum-S/p BMZ @ 23 1-2/7 weeks.  Continue Inpatient Bedrest.  Continuous tocometry. Continue Procardia 10 mg q 6 hours and Vaginal progesterone once nightly. Miralax once daily. MFM following . F/u on 02/19/17 unless needed sooner. TED hose for DVT prophylaxis.  Repeat csection unless clinically suspect that baby will deliver quickly vaginally, ie-parts in the vagina, until 25 weeks.   Continue observation.  D/w pt and nurses.  All  questions answered.  Lucky Rathke, MD

## 2017-02-19 ENCOUNTER — Inpatient Hospital Stay (HOSPITAL_COMMUNITY): Payer: Managed Care, Other (non HMO)

## 2017-02-19 LAB — CBC
HCT: 33.2 % — ABNORMAL LOW (ref 36.0–46.0)
Hemoglobin: 11.6 g/dL — ABNORMAL LOW (ref 12.0–15.0)
MCH: 29.2 pg (ref 26.0–34.0)
MCHC: 34.9 g/dL (ref 30.0–36.0)
MCV: 83.6 fL (ref 78.0–100.0)
Platelets: 240 10*3/uL (ref 150–400)
RBC: 3.97 MIL/uL (ref 3.87–5.11)
RDW: 14.6 % (ref 11.5–15.5)
WBC: 9.2 10*3/uL (ref 4.0–10.5)

## 2017-02-19 MED ORDER — POLYSACCHARIDE IRON COMPLEX 150 MG PO CAPS
150.0000 mg | ORAL_CAPSULE | Freq: Every day | ORAL | Status: DC
  Administered 2017-02-19 – 2017-02-27 (×9): 150 mg via ORAL
  Filled 2017-02-19 (×10): qty 1

## 2017-02-19 NOTE — Progress Notes (Signed)
CSW received consult for questions about insurance and paying for hospitalization once baby is born.  CSW contacted [facility] to inform of consult.  CSW screening out at this time.  Please contact CSW if additional needs arise.

## 2017-02-19 NOTE — Progress Notes (Addendum)
HD #9  Pt sleeping soundly. AFVSS EM from yesterday was reassuring, no contractions.  Addendum- Pt seen at 1430.  Bleeding has stopped, no spotting. +FM, no contractions, no LOF.   Pt is having a soft BM once daily.  Pt Ok with classical c-section if complete abruption or labor prior to 25 weeks.  CBC    Component Value Date/Time   WBC 9.2 02/19/2017 0432   RBC 3.97 02/19/2017 0432   HGB 11.6 (L) 02/19/2017 0432   HCT 33.2 (L) 02/19/2017 0432   PLT 240 02/19/2017 0432   MCV 83.6 02/19/2017 0432   MCV 56.2 (A) 02/16/2013 1755   MCH 29.2 02/19/2017 0432   MCHC 34.9 02/19/2017 0432   RDW 14.6 02/19/2017 0432   LYMPHSABS 2.5 11/11/2013 1512   MONOABS 0.5 11/11/2013 1512   EOSABS 0.1 11/11/2013 1512   BASOSABS 0.0 11/11/2013 1512   NST:  140s, Good variability.  No contractions.  Addendum: Exam Gen:  NAD, Abd: Nontender, gravid.  Ext:  TED hose on, no calf tenderness   Assessment: 1) SIUP at 24+3weeks 2) Placental abruption with active bleeding a week ago and again today 3) History suspicious for cervical insufficiency with prophylactic cerclage placed at 14 weeks 4) H/O preterm delivery at 25 weeks via classical C/S 5) s/p BMZ at 23 2/7 weeks  Plan: Continue current plan .  Procardia q 6, inpatient bedrest. MFM consult today.   Changes to plan: CBC q week or prn bleeding. NST q shift.  Tocometry prn bleeding or pain. Needs rescue dose of BMZ.  Await MFM recommendations. Try Iron once daily, continue Miralax daily. Repeat classical c-section if complete abruption or labor.  Dr. Charlotta Newton covering until 7 pm today, [facility] from 7p to 7a.  Pt informed.

## 2017-02-20 MED ORDER — LACTATED RINGERS IV BOLUS (SEPSIS)
500.0000 mL | Freq: Once | INTRAVENOUS | Status: AC
  Administered 2017-02-20: 500 mL via INTRAVENOUS

## 2017-02-20 NOTE — Progress Notes (Signed)
Pt requesting IV site be changed d/t soreness @ insertion site.

## 2017-02-20 NOTE — Progress Notes (Signed)
Pt declines IV saline lock at this time. Pt understands MD desires her to have IV access. Pt states she will notify RN when she will have IV restarted.

## 2017-02-20 NOTE — Progress Notes (Signed)
Pt declines to have IV saline lock reinserted @ this time.  Pt counseled that MD prefers she maintain IV access @ all times.  Pt states she will have IV restarted later this afternoon.

## 2017-02-20 NOTE — Progress Notes (Addendum)
HD #10  Subjective  Pt doing well.  Denies contractions, bleeding, LOF  Active fetus.      Component Value Date/Time   WBC 9.2 02/19/2017 0432   RBC 3.97 02/19/2017 0432   HGB 11.6 (L) 02/19/2017 0432   HCT 33.2 (L) 02/19/2017 0432   PLT 240 02/19/2017 0432   MCV 83.6 02/19/2017 0432   MCV 56.2 (A) 02/16/2013 1755   MCH 29.2 02/19/2017 0432   MCHC 34.9 02/19/2017 0432   RDW 14.6 02/19/2017 0432   LYMPHSABS 2.5 11/11/2013 1512   MONOABS 0.5 11/11/2013 1512   EOSABS 0.1 11/11/2013 1512   BASOSABS 0.0 11/11/2013 1512   NST:  140s, Good variability.  No contractions.  Ultrasound:  No abruption seen.  Transverse.  Assessment:  1) SIUP at 24+4weeks 2) Placental abruption with active bleeding x 2 -Stable 3) History suspicious for cervical insufficiency with prophylactic cerclage placed at 14 weeks 4) H/O preterm delivery at 25 weeks via classical C/S 5) s/p BMZ at 23 2/7 weeks 6) Transverse presentation.  Plan: Continue inpatient bedrest. CBC q week or prn bleeding. NST q shift.  Tocometry prn bleeding or pain. Needs rescue dose of BMZ.  Await MFM recommendations. Try Iron once daily, continue Miralax daily. Repeat classical c-section if complete abruption or labor. Review plan w/ MFM with diagnosis of placental abruption to optimize care.   Addendum:  Spoke with Dr. Katherina Right, MFM.  Agrees pt likely has a placental abruption.  Recommends discontinuing Procardia as tocolytics are contraindicated.  NST BID and prn contractions or bleeding.  Need to wait at least 2 weeks after previous BMZ course.

## 2017-02-21 ENCOUNTER — Encounter (HOSPITAL_COMMUNITY): Payer: Self-pay | Admitting: *Deleted

## 2017-02-21 LAB — CBC
HEMATOCRIT: 32.8 % — AB (ref 36.0–46.0)
HEMOGLOBIN: 11.3 g/dL — AB (ref 12.0–15.0)
MCH: 29 pg (ref 26.0–34.0)
MCHC: 34.5 g/dL (ref 30.0–36.0)
MCV: 84.1 fL (ref 78.0–100.0)
PLATELETS: 242 10*3/uL (ref 150–400)
RBC: 3.9 MIL/uL (ref 3.87–5.11)
RDW: 14.7 % (ref 11.5–15.5)
WBC: 9.1 10*3/uL (ref 4.0–10.5)

## 2017-02-21 LAB — TYPE AND SCREEN
ABO/RH(D): B POS
ANTIBODY SCREEN: NEGATIVE

## 2017-02-21 NOTE — Progress Notes (Signed)
Blood bank called to see if we still wanted T&S done on this patient. To be done today.

## 2017-02-21 NOTE — Progress Notes (Addendum)
Kelsey Willis is a 31 y.o. G2P0101 at [redacted]w[redacted]d HD #11 Subjective: Overnight, variables noted and IV fluids started.   Pt is comfortable.  Declines LOF, VB or contractions.  Good fetal movement.  Doing well mentally.  Pt is tolerating po iron.  Objective: BP 113/71 (BP Location: Left Arm)   Pulse 78   Temp 97.4 F (36.3 C) (Oral)   Resp 16   Ht  (1.702 m)   Wt 92.1 kg (203 lb)   LMP  (LMP Unknown)   SpO2 98%   BMI 31.79 kg/m  No intake/output data recorded. No intake/output data recorded.  Gen:  NAD, alert, comfortable Abd:  Gravid, Nontender Ext:  No calf tenderness  FHT:  FHR: 140s bpm, variability: moderate,  accelerations:  Present,  decelerations:  Present variable, spontaneous x 1 UC:   none SVE: Deferred.  Labs: Lab Results  Component Value Date   WBC 9.2 02/19/2017   HGB 11.6 (L) 02/19/2017   HCT 33.2 (L) 02/19/2017   MCV 83.6 02/19/2017   PLT 240 02/19/2017    Assessment:  1) SIUP at 24+5weeks 2) Placental abruption with active bleeding x 2- Stable          3) History suspicious for cervical insufficiency with prophylactic cerclage placed at 14 weeks 4) H/O preterm delivery at 25 weeks via classical C/S 5) s/p BMZ at 23 2/7 weeks 6) Transverse presentation.  Plan:  Discussed MFM recommendation of discontinuing tocolytics. Pt verbalized understanding.  -NST now reassuring.  Discontinue IVF.  Encouraged aggressive po hydration -Continue inpatient bedrest. -CBC q week or prn bleeding. T&S q 72. -NST q shift.  Doppler in between. Tocometry prn bleeding or pain. -Vaginal progesterone nightly. -Consider rescue BMZ @  25-26 weeks. -Try Iron once daily, continue Miralax daily. -Repeat classical c-section if complete abruption or labor. -Routine prenatal care: Glucola, HIV, RPR @ 26 weeks.  Dr. Richardson Dopp to cover starting 02/22/17 at 7 am.  Pt informed.  Geryl Rankins 02/21/2017, 1:48 PM

## 2017-02-22 NOTE — Progress Notes (Addendum)
HD #12 24 wks and 6 days with suspected abruption.   Subjective: Pt without complaints. She denies vaginal bleeding. No lof + FM no contractions no pelvic pain. Her IV was removed last night because it was irritating she asked that it not be replaced.   Vitals:   02/22/17 1140 02/22/17 1627  BP: (!) 105/53   Pulse: 80   Resp: 16 16  Temp: 98.7 F (37.1 C) 98.7 F (37.1 C)    General Alert and Oriented no acute distress.  Skin warm and moist  Lungs no distress good effort Abdomen soft nontender Extremities : No edema Pelvic Deferred however no blood seen on pad   Results for orders placed or performed during the hospital encounter of 02/10/17 (from the past 48 hour(s))  Type and screen [facility] [place]     Status: None   Collection Time: 02/21/17 11:50 AM  Result Value Ref Range   ABO/RH(D) B POS    Antibody Screen NEG    Sample Expiration 02/24/2017   CBC     Status: Abnormal   Collection Time: 02/21/17  2:15 PM  Result Value Ref Range   WBC 9.1 4.0 - 10.5 K/uL   RBC 3.90 3.87 - 5.11 MIL/uL   Hemoglobin 11.3 (L) 12.0 - 15.0 g/dL   HCT 78.2 (L) 95.6 - 21.3 %   MCV 84.1 78.0 - 100.0 fL   MCH 29.0 26.0 - 34.0 pg   MCHC 34.5 30.0 - 36.0 g/dL   RDW 08.6 57.8 - 46.9 %   Platelets 242 150 - 400 K/uL    Assessment:  1) SIUP at 24+6weeks 2) Placental abruption with active bleeding x 2- Stable 3) History suspicious for cervical insufficiency with prophylactic cerclage placed at 14 weeks 4) H/O preterm delivery at 25 weeks via classical C/S 5) s/p BMZ at 23 2/7 weeks 6) Transverse presentation.  Plan:  - Discussed with patient IV access she is not having any spotting plan to reinsert if any pink discharge or spotting is noted. She is advised that not having assess could interfere with expeditious treatment in the event of major bleed. She voiced understanding and agrees to have it replaced if she has any spotting or pink discharge.  -Continue  inpatient bedrest. -CBC q week or prn bleeding. T&S q 72. -NST q shift. Doppler in between. Tocometry prn bleeding or pain. -Vaginal progesterone nightly. -Consider rescue BMZ @  25-26 weeks. -Try Iron once daily, continue Miralax daily. -Repeat classical c-section if complete abruption or labor. -Routine prenatal care: Glucola, HIV, RPR @ 26 weeks.

## 2017-02-22 NOTE — Progress Notes (Signed)
Pt c/o IV being painful and getting caught on things and has asked it to be removed. IV discontinued. Pt declines IV insertion at this time. Risk discussed with patient. Pt verbalized understanding.

## 2017-02-23 NOTE — Progress Notes (Addendum)
HD #13 25 wks and 0 days with suspected abruption.   Subjective: Pt without complaints. She denies vaginal bleeding. No lof + FM no contractions no pelvic pain. Her IV was removed because it was irritating she asked that it not be replaced.   Vitals:   02/23/17 0357 02/23/17 0947  BP: (!) 105/45 (!) 109/56  Pulse: 65 73  Resp:  18  Temp:  98.2 F (36.8 C)    General Alert and Oriented no acute distress.  Skin warm and moist  Lungs no distress good effort Abdomen soft nontender Extremities : No edema Pelvic Deferred however no blood seen on pad   Lab Results Last 72 Hours       Results for orders placed or performed during the hospital encounter of 02/10/17 (from the past 48 hour(s))  Type and screen [facility] [place]     Status: None   Collection Time: 02/21/17 11:50 AM  Result Value Ref Range   ABO/RH(D) B POS    Antibody Screen NEG    Sample Expiration 02/24/2017   CBC     Status: Abnormal   Collection Time: 02/21/17  2:15 PM  Result Value Ref Range   WBC 9.1 4.0 - 10.5 K/uL   RBC 3.90 3.87 - 5.11 MIL/uL   Hemoglobin 11.3 (L) 12.0 - 15.0 g/dL   HCT 91.4 (L) 78.2 - 95.6 %   MCV 84.1 78.0 - 100.0 fL   MCH 29.0 26.0 - 34.0 pg   MCHC 34.5 30.0 - 36.0 g/dL   RDW 21.3 08.6 - 57.8 %   Platelets 242 150 - 400 K/uL      Assessment:  1) SIUP at 25 wks and 0 days 2) Placental abruption with active bleeding x 2- Stable 3) History suspicious for cervical insufficiency with prophylactic cerclage placed at 14 weeks 4) H/O preterm delivery at 25 weeks via classical C/S 5) s/p BMZ at 23 2/7 weeks 6) Transverse presentation.  Plan:  - Discussed with patient IV access she is not having any spotting plan to reinsert if any pink discharge or spotting is noted. She is advised that not having assess could interfere with expeditious treatment in the event of major bleed. She voiced understanding and agrees to have it replaced if she  has any spotting or pink discharge or pelvic pain .  -Continue inpatient bedrest. -CBC q week or prn bleeding. T&S q 72. -NST q shift. Doppler in between. Tocometry prn bleeding or pain. -Vaginal progesterone nightly. -Consider rescue BMZ @ 25-26 weeks. -Try Iron once daily, continue Miralax daily. -Repeat classical c-section if complete abruption or labor. -Routine prenatal care: Glucola, HIV, RPR @ 26 weeks.

## 2017-02-24 LAB — TYPE AND SCREEN
ABO/RH(D): B POS
ANTIBODY SCREEN: NEGATIVE

## 2017-02-24 NOTE — Progress Notes (Signed)
#  14 25 wks and 1 days with suspected abruption.   Subjective: Pt without complaints. She denies vaginal bleeding. No lof + FM no contractions no pelvic pain.   Vitals:   02/24/17 0826 02/24/17 1134  BP: 114/67 (!) 116/56  Pulse: 86 93  Resp: 18 18  Temp: 98.1 F (36.7 C) 98.2 F (36.8 C)   General Alert and Oriented no acute distress.  Skin warm and moist  Lungs no distress good effort Abdomen soft nontender Extremities : No edema Pelvic Deferred   EFM:.... NST this AM baseline 130's reassuring 1 variable noted. no contractions   Results for orders placed or performed during the hospital encounter of 02/10/17 (from the past 24 hour(s))  Type and screen [facility] [place]     Status: None   Collection Time: 02/24/17  9:11 AM  Result Value Ref Range   ABO/RH(D) B POS    Antibody Screen NEG    Sample Expiration 02/27/2017     Assessment:  1) SIUP at 25 wks and 1 days 2) Placental abruption with active bleeding x 2- Stable 3) History suspicious for cervical insufficiency with prophylactic cerclage placed at 14 weeks 4) H/O preterm delivery at 25 weeks via classical C/S 5) s/p BMZ at 23 2/7 weeks 6) Transverse presentation.  Plan:  - Discussed with patient IV access she is not having any spotting plan to reinsert if any pink discharge or spotting is noted. She is advised that not having assess could interfere with expeditious treatment in the event of major bleed. She voiced understanding and agrees to have it replaced if she has any spotting or pink discharge or pelvic pain .  -Continue inpatient bedrest. -CBC q week or prn bleeding. T&S q 72. -NST q shift. Doppler in between. Tocometry prn bleeding or pain. -Vaginal progesterone nightly. -Consider rescue BMZ @ 25-26 weeks. -Try Iron once daily, continue Miralax daily. -Repeat classical c-section if complete abruption or labor. -Routine prenatal care: Glucola, HIV, RPR @ 26 weeks. -Dr.  Dion Body Assuming Care at 7 am 02/25/2017

## 2017-02-25 NOTE — Progress Notes (Signed)
#  15 25 wks and 2 days   Subjective: Pt without complaints. She denies vaginal bleeding. No lof + FM no contractions no pelvic pain.  Pt requests to have her uterus removed at the time of c-section  for contraception.  Pt and husband have decided to not have any more children due to her poor obstetric history.  Pt states she originally wanted 5 children and husband wanted 3.  After discussion of contraception options, pt would like an IUD.  She was told after her last pregnancy that she could not have an IUD. Due to her h/o IC the "IUD will fall out like your baby."  Once informed she was a candidate, she quickly aborted the idea of permanent sterilization. Pt is also interested in discharge.  Previously informed she could go home if she did not have bleeding during a discussion with MFM Dr. Sherrie George.  Vitals:   02/25/17 1208 02/25/17 1210  BP:  (!) 107/53  Pulse:  87  Resp: 16   Temp: 98.1 F (36.7 C)    General Alert and Oriented no acute distress.  Skin warm and moist  Lungs no distress good effort Abdomen soft nontender, no fundal tenderness Extremities : No edema Pelvic Deferred   EFM x 3 reviewed: 140s reactive, occasional Variable deceleration, good variability.  No contractions.  No results found for this or any previous visit (from the past 24 hour(s)).  Assessment:  1) SIUP at 25 wks and 2 days 2) Placental abruption with active bleeding x 2- Stable 3) History suspicious for cervical insufficiency with prophylactic cerclage placed at 14 weeks 4) H/O preterm delivery at 25 weeks via classical C/S 5) s/p BMZ at 23 2/7 weeks 6) Transverse presentation. 7) No IV access due to pt's discomfort.  Plan:  -Continue inpatient bedrest.  Dr. Eber Jones previous note stated consider discharge if no bleeding x 7-10 days.  Today is Day 8 without bleeding.  F/u with MFM to see if discharge could be considered on Day 10.  Concerned b/c pt lives ~ 25 minutes away.  Pt informed  that if bleeding occurred at home, it could result in fetal demise, live birth but baby may have lifelong deficits or everything could be fine.  Pt verbalized understanding. -Replace IV if spotting, bleeding or pain. -CBC q week or prn bleeding. T&S q 72. -NST q shift. Doppler in between. Tocometry prn bleeding or pain. -Vaginal progesterone nightly. -Consider rescue BMZ @ 25-26 weeks. -Try Iron once daily, continue Miralax daily.  -Repeat classical c-section if complete abruption, labor, fetal distress. -Routine prenatal care: Glucola, HIV, RPR @ 26 weeks.  -IUD postpartum.

## 2017-02-26 NOTE — Progress Notes (Signed)
#  16  25 wks and 3 days  Subjective: No complaints.  Denies bleeding, loF, vaginal bleeding.  Good fetal movement. Vitals:   02/26/17 0558 02/26/17 0839  BP: 114/69 109/70  Pulse: 82 89  Resp: 19 18  Temp: 98 F (36.7 C) 98.1 F (36.7 C)   General Alert and Oriented no acute distress.  Skin warm and moist  Lungs no distress good effort Abdomen soft nontender, no fundal tenderness Extremities : No edema Pelvic Deferred   EFM x 2 reviewed: 140s reactive, occasional Variable deceleration, good variability.  No contractions.  No results found for this or any previous visit (from the past 24 hour(s)).  Assessment:  1) SIUP at 25 wks and 2 days 2) Placental abruption with active bleeding x 2- Stable 3) History suspicious for cervical insufficiency with prophylactic cerclage placed at 14 weeks 4) H/O preterm delivery at 25 weeks via classical C/S 5) s/p BMZ at 23 2/7 weeks 6) Transverse presentation. 7) No IV access due to pt's discomfort.  Plan:  -Continue inpatient bedrest.  Discuss discharge criteria with MFM. -Replace IV if spotting, bleeding or pain. -CBC q week or prn bleeding. T&S q 72. -NST q shift. Doppler in between. Tocometry prn bleeding or pain. -Vaginal progesterone nightly. -Consider rescue BMZ @ 25-26 weeks. -Try Iron once daily, continue Miralax daily.  -Repeat classical c-section if complete abruption, labor, fetal distress. -Routine prenatal care: Glucola, HIV, RPR @ 26 weeks.  -IUD postpartum.

## 2017-02-27 ENCOUNTER — Other Ambulatory Visit: Payer: Self-pay | Admitting: Obstetrics and Gynecology

## 2017-02-27 DIAGNOSIS — N883 Incompetence of cervix uteri: Secondary | ICD-10-CM

## 2017-02-27 LAB — CBC
HCT: 32 % — ABNORMAL LOW (ref 36.0–46.0)
HEMOGLOBIN: 11.1 g/dL — AB (ref 12.0–15.0)
MCH: 29.4 pg (ref 26.0–34.0)
MCHC: 34.7 g/dL (ref 30.0–36.0)
MCV: 84.7 fL (ref 78.0–100.0)
Platelets: 231 10*3/uL (ref 150–400)
RBC: 3.78 MIL/uL — AB (ref 3.87–5.11)
RDW: 14.6 % (ref 11.5–15.5)
WBC: 8.9 10*3/uL (ref 4.0–10.5)

## 2017-02-27 LAB — TYPE AND SCREEN
ABO/RH(D): B POS
ANTIBODY SCREEN: NEGATIVE

## 2017-02-27 MED ORDER — POLYSACCHARIDE IRON COMPLEX 150 MG PO CAPS: 150.0000 mg | ORAL_CAPSULE | Freq: Every day | ORAL | 2 refills | Status: AC

## 2017-02-27 MED ORDER — POLYETHYLENE GLYCOL 3350 17 G PO PACK: 17.0000 g | PACK | Freq: Every day | ORAL | 0 refills | Status: DC

## 2017-02-27 NOTE — Discharge Instructions (Signed)
Placental Abruption Placental abruption is a condition in which the placenta partly or completely separates from the uterus before the baby is born. The placenta is the organ that nourishes the unborn baby (fetus). The baby gets his or her blood supply and nutrients through the placenta. It is the baby's life support system. The placenta is attached to the inside of the uterus until after the baby is born. Placental abruption is rare, but it can happen any time after 20 weeks of pregnancy. A small separation may not cause problems, but a large separation may be dangerous for you and your baby. A large separation is usually an emergency. It requires treatment right away. What are the causes? In most cases, the cause of this condition is not known. What increases the risk? This condition is more likely to develop in women who:  Have experienced a recent trauma such as a fall, an abdominal injury, or a car accident.  Have a previous placental abruption.  Have high blood pressure (hypertension).  Smoke cigarettes, use alcohol, or use illegal drugs such as cocaine.  Have blood clotting problems.  Experience preterm premature rupture of membranes (PPROM).  Have multiples (twins, triplets, or more).  Have had children before.  Are 35 years of age or older. What are the signs or symptoms? Symptoms of this condition can vary from mild to severe. A small placental abruption may not cause symptoms, or it may cause mild symptoms, which may include:  Mild abdominal pain or lower back pain.  Slight vaginal bleeding. A severe placental abruption will cause symptoms. The symptoms will depend on the size of the separation and the stage of pregnancy. They may include:  Abdominal pain or lower back pain.  Vaginal bleeding.  Tender and hard uterus.  Severe abdominal pain with tenderness.  Continual contractions of your uterus.  Weakness and light-headedness. How is this diagnosed? This  condition may be diagnosed based on:  Your symptoms.  A physical exam.  Ultrasound.  Blood work. This will be done to make sure that there are enough healthy red blood cells and that there are no clotting problems or signs of too much blood loss. How is this treated? Treatment for placental abruption depends on the severity of the condition. For mild cases, treatment may involve monitoring your condition and managing your symptoms. This may involve:  Bed rest and close observation. For more severe cases, emergency treatment is needed. This may involve:  Staying in the hospital until you and your baby are stabilized.  Cesarean delivery of your baby.  A blood transfusion or other fluids given through an IV tube.  Other treatments, depending on:  The amount of bleeding you have.  Whether you or your baby are in distress.  The stage of your pregnancy.  The maturity of the baby. Follow these instructions at home:  Take over-the-counter and prescription medicines only as told by your health care provider. Do not take any medicines that your health care provider has not approved.  Arrange for help at home before and after you deliver your baby, especially if you had a cesarean delivery or if you lost a lot of blood.  Get plenty of rest and sleep.  Do not use illegal drugs.  Do not drink alcohol.  Do not have sexual intercourse until your health care provider says it is okay.  Do not use tampons or douche unless your health care provider says it is okay.  Do not use any products that   contain nicotine or tobacco, such as cigarettes and e-cigarettes. If you need help quitting, ask your health care provider. Get help right away if:  You have vaginal bleeding or spotting.  You have any type of trauma, such as a fall, abdominal trauma, or a car accident.  You have abdominal pain.  You have continuous uterine contractions.  You have a hard, tender uterus.  You do not  feel the baby move, or the baby moves very little. This information is not intended to replace advice given to you by your health care provider. Make sure you discuss any questions you have with your health care provider. Document Released: 10/15/2005 Document Revised: 06/14/2016 Document Reviewed: 05/06/2016 Elsevier Interactive Patient Education  2017 [employer]  

## 2017-02-27 NOTE — Discharge Summary (Signed)
Physician Discharge Summary  Patient ID: Kelsey Willis MRN: [id number] DOB/AGE: [birth year] 31 y.o.  Admit date: 02/10/2017 Discharge date: 02/27/2017  Admission Diagnoses: 23 1/7, Vaginal bleeding, h/o classical section, h/o Incompetent cervix, Cerclage in place  Discharge Diagnoses: 25 4/7, Placental abruption.  Cerclage in place. Principal Problem:   Vaginal bleeding in pregnancy, second trimester Active Problems:   H/O preterm delivery, currently pregnant, second trimester   Cervical cerclage suture present in second trimester   Placental abruption, antepartum, second trimester   History of C-section - Classical   Discharged Condition: stable  Hospital Course: Pt admitted after passing large clots at home and abdominal pain.  Ultrasound showed blood clot.  Pt had an extensive cervical exam 3 days prior so bleeding was thought to be due to that.  MFM consult obtained.  Procardia recommended for pain which was thought to be contractions.  Procardia helped pain.  Tocometry did not confirm contractions.  Pt had a second bleed passing large clots again.  Pt was then diagnosed with placental abruption by MFM.  Inpatient bedrest for 10 days post second bleed was uneventful.  Fetal monitoring was reassuring.  Pt was anemic.  Iron and Miralax started.  Consults: MFM  Significant Diagnostic Studies: labs: Hg 11.1  at discharge  Treatments: IV hydration  Discharge Exam: Blood pressure 118/64, pulse 78, temperature 98.3 F (36.8 C), temperature source Oral, resp. rate 18, height  (1.702 m), weight 92.1 kg (203 lb), SpO2 98 %. Gen:: NAD Abd:  Gravid, Nontender Ext:  No calf tenderness  Disposition: 01-Home or Self Care  Discharge Instructions    Discharge activity:  Bathroom / Shower only    Complete by:  As directed    Discharge activity: Bedrest    Complete by:  As directed    Discharge diet:  No restrictions    Complete by:  As directed    Do not have sex or do  anything that might make you have an orgasm    Complete by:  As directed    Fetal Kick Count:  Lie on our left side for one hour after a meal, and count the number of times your baby kicks.  If it is less than 5 times, get up, move around and drink some juice.  Repeat the test 30 minutes later.  If it is still less than 5 kicks in an hour, notify your doctor.    Complete by:  As directed    Notify physician for a general feeling that "something is not right"    Complete by:  As directed    Notify physician for increase or change in vaginal discharge    Complete by:  As directed    Notify physician for intestinal cramps, with or without diarrhea, sometimes described as "gas pain"    Complete by:  As directed    Notify physician for leaking of fluid    Complete by:  As directed    Notify physician for low, dull backache, unrelieved by heat or Tylenol    Complete by:  As directed    Notify physician for menstrual like cramps    Complete by:  As directed    Notify physician for pelvic pressure    Complete by:  As directed    Notify physician for uterine contractions.  These may be painless and feel like the uterus is tightening or the baby is  "balling up"    Complete by:  As directed    Notify  physician for vaginal bleeding    Complete by:  As directed    PRETERM LABOR:  Includes any of the follwing symptoms that occur between 20 - [redacted] weeks gestation.  If these symptoms are not stopped, preterm labor can result in preterm delivery, placing your baby at risk    Complete by:  As directed      Allergies as of 02/27/2017   No Known Allergies     Medication List    TAKE these medications   acetaminophen 650 MG CR tablet Commonly known as:  TYLENOL 8 HOUR Take 1 tablet (650 mg total) by mouth every 8 (eight) hours as needed for pain.   iron polysaccharides 150 MG capsule Commonly known as:  NIFEREX Take 1 capsule (150 mg total) by mouth daily.   polyethylene glycol packet Commonly  known as:  MIRALAX / GLYCOLAX Take 17 g by mouth daily.   prenatal multivitamin Tabs tablet Take 1 tablet by mouth daily at 12 noon.   progesterone 200 MG capsule Commonly known as:  PROMETRIUM Place 200 mg vaginally at bedtime.      Follow-up Information    Geryl Rankins, MD. Schedule an appointment as soon as possible for a visit on 03/04/2017.   Specialty:  Obstetrics and Gynecology Why:  Hospital follow up Contact information: [address] [place] [address] [phone]           Signed: Geryl Rankins 02/27/2017, 6:12 AM

## 2017-03-01 ENCOUNTER — Encounter (HOSPITAL_COMMUNITY): Payer: Self-pay

## 2017-03-04 LAB — OB RESULTS CONSOLE HIV ANTIBODY (ROUTINE TESTING)
HIV: NONREACTIVE
HIV: NONREACTIVE

## 2017-03-05 ENCOUNTER — Ambulatory Visit (HOSPITAL_COMMUNITY)
Admit: 2017-03-05 | Discharge: 2017-03-05 | Disposition: A | Discharge status: Home / Self Care | Payer: Managed Care, Other (non HMO) | Attending: Obstetrics and Gynecology | Admitting: Obstetrics and Gynecology

## 2017-03-05 ENCOUNTER — Other Ambulatory Visit: Payer: Self-pay | Admitting: Obstetrics and Gynecology

## 2017-03-05 ENCOUNTER — Encounter (HOSPITAL_COMMUNITY): Payer: Self-pay

## 2017-03-05 DIAGNOSIS — O09219 Supervision of pregnancy with history of pre-term labor, unspecified trimester: Secondary | ICD-10-CM

## 2017-03-05 DIAGNOSIS — N883 Incompetence of cervix uteri: Secondary | ICD-10-CM

## 2017-03-05 DIAGNOSIS — O352XX0 Maternal care for (suspected) hereditary disease in fetus, not applicable or unspecified: Secondary | ICD-10-CM

## 2017-03-05 DIAGNOSIS — O34219 Maternal care for unspecified type scar from previous cesarean delivery: Secondary | ICD-10-CM

## 2017-03-05 DIAGNOSIS — Z3A26 26 weeks gestation of pregnancy: Secondary | ICD-10-CM

## 2017-03-05 DIAGNOSIS — O09899 Supervision of other high risk pregnancies, unspecified trimester: Secondary | ICD-10-CM

## 2017-03-05 DIAGNOSIS — O3432 Maternal care for cervical incompetence, second trimester: Secondary | ICD-10-CM | POA: Diagnosis not present

## 2017-03-05 NOTE — Progress Notes (Signed)
Pt having LLQ pain x 48hrs.

## 2017-04-08 ENCOUNTER — Other Ambulatory Visit: Payer: Self-pay | Admitting: Obstetrics and Gynecology

## 2017-04-08 DIAGNOSIS — O26842 Uterine size-date discrepancy, second trimester: Secondary | ICD-10-CM

## 2017-04-11 ENCOUNTER — Encounter (HOSPITAL_COMMUNITY): Payer: Self-pay

## 2017-04-11 ENCOUNTER — Other Ambulatory Visit (HOSPITAL_COMMUNITY): Payer: Self-pay | Admitting: *Deleted

## 2017-04-11 ENCOUNTER — Ambulatory Visit (HOSPITAL_COMMUNITY): Payer: Managed Care, Other (non HMO)

## 2017-04-11 ENCOUNTER — Ambulatory Visit (HOSPITAL_COMMUNITY)
Admission: RE | Admit: 2017-04-11 | Discharge: 2017-04-11 | Disposition: A | Discharge status: Home / Self Care | Payer: Managed Care, Other (non HMO) | Source: Ambulatory Visit | Attending: Obstetrics and Gynecology | Admitting: Obstetrics and Gynecology

## 2017-04-11 DIAGNOSIS — O09213 Supervision of pregnancy with history of pre-term labor, third trimester: Secondary | ICD-10-CM | POA: Insufficient documentation

## 2017-04-11 DIAGNOSIS — O26843 Uterine size-date discrepancy, third trimester: Secondary | ICD-10-CM | POA: Diagnosis not present

## 2017-04-11 DIAGNOSIS — O26842 Uterine size-date discrepancy, second trimester: Secondary | ICD-10-CM

## 2017-04-11 DIAGNOSIS — O352XX0 Maternal care for (suspected) hereditary disease in fetus, not applicable or unspecified: Secondary | ICD-10-CM | POA: Diagnosis not present

## 2017-04-11 DIAGNOSIS — O3432 Maternal care for cervical incompetence, second trimester: Secondary | ICD-10-CM

## 2017-04-11 DIAGNOSIS — O4590 Premature separation of placenta, unspecified, unspecified trimester: Secondary | ICD-10-CM | POA: Diagnosis not present

## 2017-04-11 DIAGNOSIS — O34219 Maternal care for unspecified type scar from previous cesarean delivery: Secondary | ICD-10-CM | POA: Insufficient documentation

## 2017-04-11 DIAGNOSIS — Z3A31 31 weeks gestation of pregnancy: Secondary | ICD-10-CM | POA: Insufficient documentation

## 2017-04-11 DIAGNOSIS — O3433 Maternal care for cervical incompetence, third trimester: Secondary | ICD-10-CM | POA: Diagnosis not present

## 2017-04-29 ENCOUNTER — Encounter (HOSPITAL_COMMUNITY): Payer: Self-pay

## 2017-05-02 ENCOUNTER — Encounter (HOSPITAL_COMMUNITY): Payer: Self-pay

## 2017-05-02 ENCOUNTER — Ambulatory Visit (HOSPITAL_COMMUNITY)
Admission: RE | Admit: 2017-05-02 | Discharge: 2017-05-02 | Disposition: A | Discharge status: Home / Self Care | Payer: Managed Care, Other (non HMO) | Source: Ambulatory Visit | Attending: Obstetrics and Gynecology | Admitting: Obstetrics and Gynecology

## 2017-05-02 ENCOUNTER — Other Ambulatory Visit (HOSPITAL_COMMUNITY): Payer: Self-pay | Admitting: Obstetrics and Gynecology

## 2017-05-02 DIAGNOSIS — O09219 Supervision of pregnancy with history of pre-term labor, unspecified trimester: Secondary | ICD-10-CM

## 2017-05-02 DIAGNOSIS — O34219 Maternal care for unspecified type scar from previous cesarean delivery: Secondary | ICD-10-CM

## 2017-05-02 DIAGNOSIS — O358XX0 Maternal care for other (suspected) fetal abnormality and damage, not applicable or unspecified: Secondary | ICD-10-CM | POA: Diagnosis present

## 2017-05-02 DIAGNOSIS — Z3A34 34 weeks gestation of pregnancy: Secondary | ICD-10-CM | POA: Diagnosis not present

## 2017-05-02 DIAGNOSIS — O4593 Premature separation of placenta, unspecified, third trimester: Secondary | ICD-10-CM

## 2017-05-02 DIAGNOSIS — O09899 Supervision of other high risk pregnancies, unspecified trimester: Secondary | ICD-10-CM

## 2017-05-02 DIAGNOSIS — O3432 Maternal care for cervical incompetence, second trimester: Secondary | ICD-10-CM

## 2017-05-02 DIAGNOSIS — O352XX0 Maternal care for (suspected) hereditary disease in fetus, not applicable or unspecified: Secondary | ICD-10-CM

## 2017-05-08 ENCOUNTER — Other Ambulatory Visit: Payer: Self-pay | Admitting: Obstetrics and Gynecology

## 2017-05-09 ENCOUNTER — Inpatient Hospital Stay (HOSPITAL_COMMUNITY)
Admission: AD | Admit: 2017-05-09 | Discharge: 2017-05-09 | Disposition: A | Discharge status: Home / Self Care | Payer: Managed Care, Other (non HMO) | Source: Ambulatory Visit | Attending: Obstetrics and Gynecology | Admitting: Obstetrics and Gynecology

## 2017-05-09 DIAGNOSIS — O3433 Maternal care for cervical incompetence, third trimester: Secondary | ICD-10-CM | POA: Insufficient documentation

## 2017-05-09 DIAGNOSIS — Z3A35 35 weeks gestation of pregnancy: Secondary | ICD-10-CM | POA: Insufficient documentation

## 2017-05-09 MED ORDER — BETAMETHASONE SOD PHOS & ACET 6 (3-3) MG/ML IJ SUSP
12.0000 mg | Freq: Once | INTRAMUSCULAR | Status: AC
  Administered 2017-05-09: 12 mg via INTRAMUSCULAR
  Filled 2017-05-09: qty 2

## 2017-05-09 NOTE — MAU Note (Signed)
Pt here for betamethasone injection.

## 2017-05-10 ENCOUNTER — Inpatient Hospital Stay (HOSPITAL_COMMUNITY)
Admission: AD | Admit: 2017-05-10 | Discharge: 2017-05-10 | Disposition: A | Discharge status: Home / Self Care | Payer: Managed Care, Other (non HMO) | Source: Ambulatory Visit | Attending: Obstetrics and Gynecology | Admitting: Obstetrics and Gynecology

## 2017-05-10 DIAGNOSIS — Z3A35 35 weeks gestation of pregnancy: Secondary | ICD-10-CM | POA: Insufficient documentation

## 2017-05-10 DIAGNOSIS — O3433 Maternal care for cervical incompetence, third trimester: Secondary | ICD-10-CM | POA: Insufficient documentation

## 2017-05-10 MED ORDER — BETAMETHASONE SOD PHOS & ACET 6 (3-3) MG/ML IJ SUSP
12.0000 mg | Freq: Once | INTRAMUSCULAR | Status: AC
  Administered 2017-05-10: 12 mg via INTRAMUSCULAR
  Filled 2017-05-10: qty 2

## 2017-05-10 NOTE — Addendum Note (Signed)
Addendum  created 05/10/17 1654 by Cristela BlueJackson, Allene Furuya, MD   Sign clinical note

## 2017-05-10 NOTE — Anesthesia Postprocedure Evaluation (Signed)
Anesthesia Post Note  Patient: Kelsey LevyChristina L Willis  Procedure(s) Performed: Procedure(s) (LRB): CERCLAGE CERVICAL (N/A)     Anesthesia Post Evaluation  Last Vitals:  Vitals:   12/12/16 1445 12/12/16 1617  BP: 122/82 114/75  Pulse: 90 87  Resp: 18 20  Temp:  36.9 C    Last Pain:  Vitals:   12/12/16 1617  PainSc: 2                  Jelena Malicoat EDWARD

## 2017-05-13 ENCOUNTER — Encounter (HOSPITAL_COMMUNITY)
Admission: RE | Admit: 2017-05-13 | Discharge: 2017-05-13 | Disposition: A | Discharge status: Home / Self Care | Payer: Managed Care, Other (non HMO) | Source: Ambulatory Visit | Attending: Obstetrics and Gynecology | Admitting: Obstetrics and Gynecology

## 2017-05-13 HISTORY — DX: Hypertrophic scar: L91.0

## 2017-05-13 LAB — CBC
HCT: 33 % — ABNORMAL LOW (ref 36.0–46.0)
HEMOGLOBIN: 11.1 g/dL — AB (ref 12.0–15.0)
MCH: 27.8 pg (ref 26.0–34.0)
MCHC: 33.6 g/dL (ref 30.0–36.0)
MCV: 82.7 fL (ref 78.0–100.0)
PLATELETS: 234 10*3/uL (ref 150–400)
RBC: 3.99 MIL/uL (ref 3.87–5.11)
RDW: 13.7 % (ref 11.5–15.5)
WBC: 8 10*3/uL (ref 4.0–10.5)

## 2017-05-13 NOTE — H&P (Signed)
Kelsey Willis is a 31 y.o. female G2 3P0101 @ 35 3/7 weeks revised by an 11 week ultrasound with a h/o Classical cesarean section, Incompetent cervix s/p elective cerclage at 14 weeks is scheduled for a repeat c-section and cerclage removal at 36 5/7 weeks.  Pt will complete her second course of BMZ 48 hours prior to c-section. The first course of steroids was given at 23 weeks after placental abruption.  Pt remained inpatient x 10 days. Pregnancy has been complicated as above.  Pt has a h/o asthma but did not require treatment this pregnancy.  Last incision resulted in a keloid.   OB History    Gravida Para Term Preterm AB Living   2 1 0 1 0 1   SAB TAB Ectopic Multiple Live Births   0 0 0 0 1     Past Medical History:  Diagnosis Date  . Headache    have noticed since pregnancy  . Incompetent cervix   . Keloid    Past Surgical History:  Procedure Laterality Date  . CERVICAL CERCLAGE N/A 12/12/2016   Procedure: CERCLAGE CERVICAL;  Surgeon: Kelsey RankinsEvelyn Tilia Faso, MD;  Location: [facility];  Service: Gynecology;  Laterality: N/A;  . CESAREAN SECTION     classical incision  . WISDOM TOOTH EXTRACTION     Family History: family history includes Asthma in her mother; Birth defects in her son; Cancer in her maternal aunt, maternal grandmother, and paternal grandmother; Diabetes in her maternal grandfather, maternal grandmother, paternal grandfather, and paternal grandmother; Heart disease in her father, maternal grandfather, and paternal grandfather; Hypertension in her father and mother; Thrombosis in her maternal grandfather. Social History:  reports that she has never smoked. She has never used smokeless tobacco. She reports that she does not drink alcohol or use drugs.     Maternal Diabetes: No Genetic Screening: Declined Maternal Ultrasounds/Referrals: Abnormal:  Findings:   Other:Placental abruption at 23 weeks Fetal Ultrasounds or other Referrals:  Referred to Materal Fetal Medicine   Maternal Substance Abuse:  No Significant Maternal Medications:  Meds include: Other: Prometrium Significant Maternal Lab Results:  Lab values include: Other: None Other Comments:  BMZ at 23 and 35 weeks.  Review of Systems  Constitutional: Negative.   Gastrointestinal: Negative for abdominal pain.   Maternal Medical History:  Contractions: Frequency: irregular.    Fetal activity: Perceived fetal activity is normal.    Prenatal complications: Placental abruption at 22 weeks. H/o Incompetent cervix. Elective cerclage at 14 weeks.  Prenatal Complications - Diabetes: none.      There were no vitals taken for this visit. Maternal Exam:  Uterine Assessment: Contraction frequency is irregular.   Abdomen: Estimated fetal weight is 5 lbs 8 oz at 34 weeks.   Fetal presentation: vertex  Introitus: Normal vulva. Cervix: Cervix evaluated by sterile speculum exam.   Visually closed.  Knot visible at 12 o'clock.  Fetal Exam Fetal Monitor Review: Mode: hand-held doppler probe.   Baseline rate: 140s.      Physical Exam  Constitutional: She is oriented to person, place, and time. She appears well-developed and well-nourished. No distress.  HENT:  Head: Normocephalic and atraumatic.  Eyes: EOM are normal.  Neck: Normal range of motion.  Respiratory: Effort normal and breath sounds normal. No respiratory distress.  GI: There is no tenderness.  Musculoskeletal: Normal range of motion. She exhibits no edema.  Neurological: She is alert and oriented to person, place, and time.  Skin: Skin is warm and dry.  Psychiatric:  She has a normal mood and affect.    Prenatal labs: ABO, Rh: --/--/B POS (07/16 0930) Antibody: NEG (07/16 0930) Rubella:   RPR:    HBsAg:    HIV:    GBS:   Deferred  Assessment/Plan: IUP @ 35 2/7 weeks H/o Classical cesarean section H/o Placental abruption H/o Incompetent cervix, s/p elective cerclage. Keloid  Repeat c-section at 36 3/7 weeks.    BMZ 48  hours prior to delivery. Pt counseled on risk of repeat c-section,including bleeding, placental abruption. Scar revision.  Inject Kenalog at end of surgery.  Pt counseled on risk of hypopigmentation.   Kelsey Willis, Kelsey Willis

## 2017-05-13 NOTE — Patient Instructions (Signed)
20 Kelsey LevyChristina L Willis  05/13/2017   Your procedure is scheduled on:  05/14/2017   Enter through the Main Entrance of [facility] at 1030 AM.  Pick up the phone at the desk and dial [phone].   Call this number if you have problems the morning of surgery: [phone]   Remember:   Do not eat food:After Midnight.  Do not drink clear liquids: After Midnight.  Take these medicines the morning of surgery with A SIP OF WATER: none   Do not wear jewelry, make-up or nail polish.  Do not wear lotions, powders, or perfumes. Do not wear deodorant.  Do not shave 48 hours prior to surgery.  Do not bring valuables to the hospital.  [facility] is not   responsible for any belongings or valuables brought to the hospital.  Contacts, dentures or bridgework may not be worn into surgery.  Leave suitcase in the car. After surgery it may be brought to your room.  For patients admitted to the hospital, checkout time is 11:00 AM the day of              discharge.   Patients discharged the day of surgery will not be allowed to drive             home.  Name and phone number of your driver: na  Special Instructions:   N/A   Please read over the following fact sheets that you were given:   Surgical Site Infection Prevention

## 2017-05-14 ENCOUNTER — Encounter (HOSPITAL_COMMUNITY): Payer: Self-pay | Admitting: *Deleted

## 2017-05-14 ENCOUNTER — Encounter (HOSPITAL_COMMUNITY)
Admission: RE | Disposition: A | Discharge status: Home / Self Care | Payer: Self-pay | Source: Ambulatory Visit | Attending: Obstetrics and Gynecology

## 2017-05-14 ENCOUNTER — Inpatient Hospital Stay (HOSPITAL_COMMUNITY): Payer: Managed Care, Other (non HMO) | Admitting: Anesthesiology

## 2017-05-14 ENCOUNTER — Inpatient Hospital Stay (HOSPITAL_COMMUNITY)
Admission: RE | Admit: 2017-05-14 | Discharge: 2017-05-17 | DRG: 765 | Disposition: A | Discharge status: Home / Self Care | Payer: Managed Care, Other (non HMO) | Source: Ambulatory Visit | Attending: Obstetrics and Gynecology | Admitting: Obstetrics and Gynecology

## 2017-05-14 DIAGNOSIS — Z98891 History of uterine scar from previous surgery: Secondary | ICD-10-CM

## 2017-05-14 DIAGNOSIS — L910 Hypertrophic scar: Secondary | ICD-10-CM | POA: Diagnosis present

## 2017-05-14 DIAGNOSIS — O3433 Maternal care for cervical incompetence, third trimester: Secondary | ICD-10-CM | POA: Diagnosis present

## 2017-05-14 DIAGNOSIS — O4593 Premature separation of placenta, unspecified, third trimester: Secondary | ICD-10-CM | POA: Diagnosis present

## 2017-05-14 DIAGNOSIS — O34212 Maternal care for vertical scar from previous cesarean delivery: Principal | ICD-10-CM | POA: Diagnosis present

## 2017-05-14 DIAGNOSIS — Z3A35 35 weeks gestation of pregnancy: Secondary | ICD-10-CM

## 2017-05-14 LAB — RPR: RPR: NONREACTIVE

## 2017-05-14 LAB — PREPARE RBC (CROSSMATCH)

## 2017-05-14 SURGERY — Surgical Case
Anesthesia: Spinal

## 2017-05-14 MED ORDER — KETOROLAC TROMETHAMINE 30 MG/ML IJ SOLN
30.0000 mg | Freq: Four times a day (QID) | INTRAMUSCULAR | Status: AC | PRN
  Administered 2017-05-14: 30 mg via INTRAMUSCULAR

## 2017-05-14 MED ORDER — OXYCODONE HCL 5 MG PO TABS: 5.0000 mg | ORAL_TABLET | Freq: Once | ORAL | Status: DC | PRN

## 2017-05-14 MED ORDER — PHENYLEPHRINE 40 MCG/ML (10ML) SYRINGE FOR IV PUSH (FOR BLOOD PRESSURE SUPPORT)
PREFILLED_SYRINGE | INTRAVENOUS | Status: AC
  Filled 2017-05-14: qty 10

## 2017-05-14 MED ORDER — KETOROLAC TROMETHAMINE 30 MG/ML IJ SOLN: 30.0000 mg | Freq: Four times a day (QID) | INTRAMUSCULAR | Status: AC | PRN

## 2017-05-14 MED ORDER — SIMETHICONE 80 MG PO CHEW
80.0000 mg | CHEWABLE_TABLET | Freq: Three times a day (TID) | ORAL | Status: DC
  Administered 2017-05-15 – 2017-05-17 (×8): 80 mg via ORAL
  Filled 2017-05-14 (×8): qty 1

## 2017-05-14 MED ORDER — FENTANYL CITRATE (PF) 100 MCG/2ML IJ SOLN
INTRAMUSCULAR | Status: AC
  Filled 2017-05-14: qty 2

## 2017-05-14 MED ORDER — BUPIVACAINE HCL (PF) 0.25 % IJ SOLN
INTRAMUSCULAR | Status: AC
  Filled 2017-05-14: qty 30

## 2017-05-14 MED ORDER — SCOPOLAMINE 1 MG/3DAYS TD PT72
MEDICATED_PATCH | TRANSDERMAL | Status: DC | PRN
  Administered 2017-05-14: 1 via TRANSDERMAL

## 2017-05-14 MED ORDER — DIPHENHYDRAMINE HCL 50 MG/ML IJ SOLN: 12.5000 mg | INTRAMUSCULAR | Status: DC | PRN

## 2017-05-14 MED ORDER — CHLOROPROCAINE HCL (PF) 3 % IJ SOLN
INTRAMUSCULAR | Status: DC | PRN
  Administered 2017-05-14: 20 mL

## 2017-05-14 MED ORDER — DIPHENHYDRAMINE HCL 25 MG PO CAPS
25.0000 mg | ORAL_CAPSULE | ORAL | Status: DC | PRN
  Filled 2017-05-14: qty 1

## 2017-05-14 MED ORDER — MEPERIDINE HCL 25 MG/ML IJ SOLN: 6.2500 mg | INTRAMUSCULAR | Status: DC | PRN

## 2017-05-14 MED ORDER — ONDANSETRON HCL 4 MG/2ML IJ SOLN
INTRAMUSCULAR | Status: DC | PRN
  Administered 2017-05-14: 4 mg via INTRAVENOUS

## 2017-05-14 MED ORDER — ONDANSETRON HCL 4 MG/2ML IJ SOLN: 4.0000 mg | Freq: Four times a day (QID) | INTRAMUSCULAR | Status: DC | PRN

## 2017-05-14 MED ORDER — TETANUS-DIPHTH-ACELL PERTUSSIS 5-2.5-18.5 LF-MCG/0.5 IM SUSP: 0.5000 mL | Freq: Once | INTRAMUSCULAR | Status: DC

## 2017-05-14 MED ORDER — MENTHOL 3 MG MT LOZG: 1.0000 | LOZENGE | OROMUCOSAL | Status: DC | PRN

## 2017-05-14 MED ORDER — NALBUPHINE SYRINGE 5 MG/0.5 ML
5.0000 mg | INJECTION | INTRAMUSCULAR | Status: DC | PRN
  Filled 2017-05-14: qty 0.5

## 2017-05-14 MED ORDER — BUPIVACAINE IN DEXTROSE 0.75-8.25 % IT SOLN
INTRATHECAL | Status: DC | PRN
  Administered 2017-05-14: 13 mg via INTRATHECAL

## 2017-05-14 MED ORDER — TRIAMCINOLONE ACETONIDE 40 MG/ML IJ SUSP
INTRAMUSCULAR | Status: DC | PRN
  Administered 2017-05-14: 40 mg via INTRAMUSCULAR

## 2017-05-14 MED ORDER — OXYCODONE-ACETAMINOPHEN 5-325 MG PO TABS
1.0000 | ORAL_TABLET | ORAL | Status: DC | PRN
  Administered 2017-05-16: 1 via ORAL
  Filled 2017-05-14 (×2): qty 1

## 2017-05-14 MED ORDER — ZOLPIDEM TARTRATE 5 MG PO TABS: 5.0000 mg | ORAL_TABLET | Freq: Every evening | ORAL | Status: DC | PRN

## 2017-05-14 MED ORDER — WITCH HAZEL-GLYCERIN EX PADS: 1.0000 "application " | MEDICATED_PAD | CUTANEOUS | Status: DC | PRN

## 2017-05-14 MED ORDER — LACTATED RINGERS IV SOLN
INTRAVENOUS | Status: DC
  Administered 2017-05-14 (×3): via INTRAVENOUS

## 2017-05-14 MED ORDER — NALBUPHINE SYRINGE 5 MG/0.5 ML
5.0000 mg | INJECTION | Freq: Once | INTRAMUSCULAR | Status: DC | PRN
  Filled 2017-05-14: qty 0.5

## 2017-05-14 MED ORDER — FENTANYL CITRATE (PF) 100 MCG/2ML IJ SOLN
INTRAMUSCULAR | Status: DC | PRN
  Administered 2017-05-14: 10 ug via INTRATHECAL

## 2017-05-14 MED ORDER — NALOXONE HCL 2 MG/2ML IJ SOSY
1.0000 ug/kg/h | PREFILLED_SYRINGE | INTRAVENOUS | Status: DC | PRN
  Filled 2017-05-14: qty 2

## 2017-05-14 MED ORDER — METHYLERGONOVINE MALEATE 0.2 MG PO TABS: 0.2000 mg | ORAL_TABLET | ORAL | Status: DC | PRN

## 2017-05-14 MED ORDER — POLYSACCHARIDE IRON COMPLEX 150 MG PO CAPS
150.0000 mg | ORAL_CAPSULE | Freq: Every day | ORAL | Status: DC
  Administered 2017-05-15: 150 mg via ORAL
  Filled 2017-05-14 (×5): qty 1

## 2017-05-14 MED ORDER — TRIAMCINOLONE ACETONIDE 40 MG/ML IJ SUSP
40.0000 mg | Freq: Once | INTRAMUSCULAR | Status: DC
  Filled 2017-05-14: qty 1

## 2017-05-14 MED ORDER — LACTATED RINGERS IV SOLN
INTRAVENOUS | Status: DC | PRN
  Administered 2017-05-14: 40 [IU] via INTRAVENOUS

## 2017-05-14 MED ORDER — OXYCODONE HCL 5 MG/5ML PO SOLN: 5.0000 mg | Freq: Once | ORAL | Status: DC | PRN

## 2017-05-14 MED ORDER — PHENYLEPHRINE 8 MG IN D5W 100 ML (0.08MG/ML) PREMIX OPTIME
INJECTION | INTRAVENOUS | Status: AC
Start: 2017-05-14 — End: 2017-05-14
  Filled 2017-05-14: qty 100

## 2017-05-14 MED ORDER — OXYTOCIN 40 UNITS IN LACTATED RINGERS INFUSION - SIMPLE MED: 2.5000 [IU]/h | INTRAVENOUS | Status: AC

## 2017-05-14 MED ORDER — DIBUCAINE 1 % RE OINT: 1.0000 "application " | TOPICAL_OINTMENT | RECTAL | Status: DC | PRN

## 2017-05-14 MED ORDER — OXYTOCIN 10 UNIT/ML IJ SOLN
INTRAMUSCULAR | Status: AC
  Filled 2017-05-14: qty 4

## 2017-05-14 MED ORDER — DIPHENHYDRAMINE HCL 25 MG PO CAPS: 25.0000 mg | ORAL_CAPSULE | Freq: Four times a day (QID) | ORAL | Status: DC | PRN

## 2017-05-14 MED ORDER — CHLOROPROCAINE HCL (PF) 3 % IJ SOLN
INTRAMUSCULAR | Status: AC
  Filled 2017-05-14: qty 20

## 2017-05-14 MED ORDER — SODIUM CHLORIDE 0.9% FLUSH: 3.0000 mL | INTRAVENOUS | Status: DC | PRN

## 2017-05-14 MED ORDER — PHENYLEPHRINE 8 MG IN D5W 100 ML (0.08MG/ML) PREMIX OPTIME
INJECTION | INTRAVENOUS | Status: DC | PRN
  Administered 2017-05-14: 60 ug/min via INTRAVENOUS

## 2017-05-14 MED ORDER — FENTANYL CITRATE (PF) 100 MCG/2ML IJ SOLN
25.0000 ug | INTRAMUSCULAR | Status: DC | PRN
Start: 2017-05-14 — End: 2017-05-14
  Administered 2017-05-14 (×2): 50 ug via INTRAVENOUS

## 2017-05-14 MED ORDER — LACTATED RINGERS IV SOLN
INTRAVENOUS | Status: DC
  Administered 2017-05-14 – 2017-05-15 (×2): via INTRAVENOUS

## 2017-05-14 MED ORDER — CEFAZOLIN SODIUM-DEXTROSE 2-4 GM/100ML-% IV SOLN
2.0000 g | INTRAVENOUS | Status: AC
  Administered 2017-05-14: 2 g via INTRAVENOUS
  Filled 2017-05-14: qty 100

## 2017-05-14 MED ORDER — PRENATAL MULTIVITAMIN CH
1.0000 | ORAL_TABLET | Freq: Every day | ORAL | Status: DC
  Administered 2017-05-15 – 2017-05-17 (×3): 1 via ORAL
  Filled 2017-05-14 (×2): qty 1

## 2017-05-14 MED ORDER — MORPHINE SULFATE-NACL 0.5-0.9 MG/ML-% IV SOSY
PREFILLED_SYRINGE | INTRAVENOUS | Status: DC | PRN
  Administered 2017-05-14: .2 mg via INTRATHECAL

## 2017-05-14 MED ORDER — COCONUT OIL OIL: 1.0000 "application " | TOPICAL_OIL | Status: DC | PRN

## 2017-05-14 MED ORDER — SENNOSIDES-DOCUSATE SODIUM 8.6-50 MG PO TABS
2.0000 | ORAL_TABLET | ORAL | Status: DC
  Administered 2017-05-15 – 2017-05-16 (×3): 2 via ORAL
  Filled 2017-05-14 (×3): qty 2

## 2017-05-14 MED ORDER — ACETAMINOPHEN 325 MG PO TABS: 650.0000 mg | ORAL_TABLET | ORAL | Status: DC | PRN

## 2017-05-14 MED ORDER — ONDANSETRON HCL 4 MG/2ML IJ SOLN
INTRAMUSCULAR | Status: AC
  Filled 2017-05-14: qty 2

## 2017-05-14 MED ORDER — METHYLERGONOVINE MALEATE 0.2 MG/ML IJ SOLN: 0.2000 mg | INTRAMUSCULAR | Status: DC | PRN

## 2017-05-14 MED ORDER — SCOPOLAMINE 1 MG/3DAYS TD PT72: 1.0000 | MEDICATED_PATCH | Freq: Once | TRANSDERMAL | Status: DC

## 2017-05-14 MED ORDER — IBUPROFEN 600 MG PO TABS
600.0000 mg | ORAL_TABLET | Freq: Four times a day (QID) | ORAL | Status: DC
  Administered 2017-05-15 – 2017-05-17 (×11): 600 mg via ORAL
  Filled 2017-05-14 (×11): qty 1

## 2017-05-14 MED ORDER — NALOXONE HCL 0.4 MG/ML IJ SOLN: 0.4000 mg | INTRAMUSCULAR | Status: DC | PRN

## 2017-05-14 MED ORDER — EPINEPHRINE PF 1 MG/10ML IJ SOSY
PREFILLED_SYRINGE | INTRAMUSCULAR | Status: AC
  Filled 2017-05-14: qty 10

## 2017-05-14 MED ORDER — BUPIVACAINE IN DEXTROSE 0.75-8.25 % IT SOLN
INTRATHECAL | Status: AC
  Filled 2017-05-14: qty 2

## 2017-05-14 MED ORDER — SIMETHICONE 80 MG PO CHEW
80.0000 mg | CHEWABLE_TABLET | ORAL | Status: DC
  Administered 2017-05-15 – 2017-05-16 (×3): 80 mg via ORAL
  Filled 2017-05-14 (×3): qty 1

## 2017-05-14 MED ORDER — OXYCODONE-ACETAMINOPHEN 5-325 MG PO TABS
2.0000 | ORAL_TABLET | ORAL | Status: DC | PRN
  Administered 2017-05-16: 2 via ORAL
  Filled 2017-05-14: qty 2

## 2017-05-14 MED ORDER — LIDOCAINE HCL 1 % IJ SOLN
INTRAMUSCULAR | Status: AC
  Filled 2017-05-14: qty 20

## 2017-05-14 MED ORDER — PHENYLEPHRINE HCL 10 MG/ML IJ SOLN
INTRAMUSCULAR | Status: DC | PRN
  Administered 2017-05-14 (×3): 80 ug via INTRAVENOUS

## 2017-05-14 MED ORDER — MORPHINE SULFATE (PF) 0.5 MG/ML IJ SOLN
INTRAMUSCULAR | Status: AC
  Filled 2017-05-14: qty 10

## 2017-05-14 MED ORDER — SODIUM CHLORIDE 0.9 % IR SOLN
Status: DC | PRN
  Administered 2017-05-14: 1

## 2017-05-14 MED ORDER — BUPIVACAINE HCL (PF) 0.25 % IJ SOLN
INTRAMUSCULAR | Status: DC | PRN
  Administered 2017-05-14: 20 mL

## 2017-05-14 MED ORDER — ONDANSETRON HCL 4 MG/2ML IJ SOLN
4.0000 mg | Freq: Three times a day (TID) | INTRAMUSCULAR | Status: DC | PRN
Start: 2017-05-14 — End: 2017-05-17

## 2017-05-14 MED ORDER — KETOROLAC TROMETHAMINE 30 MG/ML IJ SOLN
INTRAMUSCULAR | Status: AC
  Filled 2017-05-14: qty 1

## 2017-05-14 MED ORDER — SIMETHICONE 80 MG PO CHEW: 80.0000 mg | CHEWABLE_TABLET | ORAL | Status: DC | PRN

## 2017-05-14 SURGICAL SUPPLY — 39 items
BARRIER ADHS 3X4 INTERCEED (GAUZE/BANDAGES/DRESSINGS) ×2 IMPLANT
BENZOIN TINCTURE PRP APPL 2/3 (GAUZE/BANDAGES/DRESSINGS) ×2 IMPLANT
CHLORAPREP W/TINT 26ML (MISCELLANEOUS) ×2 IMPLANT
CLAMP CORD UMBIL (MISCELLANEOUS) IMPLANT
CLOTH BEACON ORANGE TIMEOUT ST (SAFETY) ×2 IMPLANT
DERMABOND ADVANCED (GAUZE/BANDAGES/DRESSINGS)
DERMABOND ADVANCED .7 DNX12 (GAUZE/BANDAGES/DRESSINGS) IMPLANT
DRSG OPSITE POSTOP 4X10 (GAUZE/BANDAGES/DRESSINGS) ×2 IMPLANT
ELECT REM PT RETURN 9FT ADLT (ELECTROSURGICAL) ×2
ELECTRODE REM PT RTRN 9FT ADLT (ELECTROSURGICAL) ×1 IMPLANT
EXTRACTOR VACUUM BELL STYLE (SUCTIONS) IMPLANT
GLOVE BIO SURGEON STRL SZ7 (GLOVE) ×2 IMPLANT
GLOVE BIOGEL PI IND STRL 7.0 (GLOVE) ×2 IMPLANT
GLOVE BIOGEL PI INDICATOR 7.0 (GLOVE) ×2
GOWN STRL REUS W/TWL LRG LVL3 (GOWN DISPOSABLE) ×4 IMPLANT
KIT ABG SYR 3ML LUER SLIP (SYRINGE) IMPLANT
NEEDLE HYPO 22GX1.5 SAFETY (NEEDLE) ×4 IMPLANT
NEEDLE HYPO 25X5/8 SAFETYGLIDE (NEEDLE) IMPLANT
NS IRRIG 1000ML POUR BTL (IV SOLUTION) ×2 IMPLANT
PACK C SECTION WH (CUSTOM PROCEDURE TRAY) ×2 IMPLANT
PAD ABD 7.5X8 STRL (GAUZE/BANDAGES/DRESSINGS) ×2 IMPLANT
PAD OB MATERNITY 4.3X12.25 (PERSONAL CARE ITEMS) ×2 IMPLANT
PENCIL SMOKE EVAC W/HOLSTER (ELECTROSURGICAL) ×2 IMPLANT
RTRCTR C-SECT PINK 25CM LRG (MISCELLANEOUS) ×2 IMPLANT
SPONGE GAUZE 4X4 12PLY STER LF (GAUZE/BANDAGES/DRESSINGS) ×4 IMPLANT
SPONGE LAP 18X18 X RAY DECT (DISPOSABLE) ×2 IMPLANT
STRIP CLOSURE SKIN 1/2X4 (GAUZE/BANDAGES/DRESSINGS) ×2 IMPLANT
SUT CHROMIC 0 CTX 36 (SUTURE) IMPLANT
SUT MON AB 4-0 PS1 27 (SUTURE) ×2 IMPLANT
SUT PLAIN 0 NONE (SUTURE) IMPLANT
SUT PLAIN 2 0 XLH (SUTURE) ×4 IMPLANT
SUT VIC AB 0 CTX 36 (SUTURE) ×5
SUT VIC AB 0 CTX36XBRD ANBCTRL (SUTURE) ×5 IMPLANT
SUT VIC AB 2-0 CT1 27 (SUTURE) ×1
SUT VIC AB 2-0 CT1 TAPERPNT 27 (SUTURE) ×1 IMPLANT
SYR 10ML LL (SYRINGE) ×2 IMPLANT
SYR CONTROL 10ML LL (SYRINGE) ×2 IMPLANT
TOWEL OR 17X24 6PK STRL BLUE (TOWEL DISPOSABLE) ×2 IMPLANT
TRAY FOLEY BAG SILVER LF 14FR (SET/KITS/TRAYS/PACK) IMPLANT

## 2017-05-14 NOTE — Brief Op Note (Signed)
05/14/2017  2:27 PM  PATIENT:  Kelsey Willis  31 y.o. female  PRE-OPERATIVE DIAGNOSIS:  IUP @ 36 3/7 weeks.History of previous classical c/s; incompetent cervix; keloid   POST-OPERATIVE DIAGNOSIS:  History of previous classical c/s; incompenent cervix; keloid   PROCEDURE:  Procedure(s) with comments: CESAREAN SECTION (N/A) - Cerclage Removal  Order of patient care Spinal Place patient in stirrups for cerclage removal Take patient out of stirrups and position for c-section NON OB PROCEDURE (N/A) - cerclage removal  Procedure:  Scar revision  SURGEON:  Surgeon(s) and Role: Panel 1:    * Geryl RankinsVarnado, Durrell Barajas, MD - Primary    * Myna Hidalgozan, Jennifer, DO - Assisting  Panel 2:    * Geryl RankinsVarnado, Mykah Bellomo, MD - Primary  PHYSICIAN ASSISTANT: None  ASSISTANTS: Technician   ANESTHESIA:   spinal  EBL:  Total I/O In: 2300 [I.V.:2300] Out: 1200 [Urine:400; Blood:800]  BLOOD ADMINISTERED:none  DRAINS: Urinary Catheter (Foley)   LOCAL MEDICATIONS USED:  MARCAINE   , Amount: 20 ml and OTHER Kenalog 40 mg/ml mixed with Marcaine  SPECIMEN:  Source of Specimen:  Placenta  DISPOSITION OF SPECIMEN:  PATHOLOGY  COUNTS:  YES  TOURNIQUET:  * No tourniquets in log *  DICTATION: .Other Dictation: Dictation Number [id number]  PLAN OF CARE: Admit to inpatient   PATIENT DISPOSITION:  PACU - hemodynamically stable.   Delay start of Pharmacological VTE agent (>24hrs) due to surgical blood loss or risk of bleeding: yes

## 2017-05-14 NOTE — Anesthesia Preprocedure Evaluation (Signed)
Anesthesia Evaluation  Patient identified by MRN, date of birth, ID band Patient awake    Reviewed: Allergy & Precautions, H&P , NPO status , Patient's Chart, lab work & pertinent test results  Airway Mallampati: II   Neck ROM: full    Dental   Pulmonary neg pulmonary ROS,    breath sounds clear to auscultation       Cardiovascular negative cardio ROS   Rhythm:regular Rate:Normal     Neuro/Psych  Headaches,    GI/Hepatic   Endo/Other    Renal/GU      Musculoskeletal   Abdominal   Peds  Hematology   Anesthesia Other Findings   Reproductive/Obstetrics (+) Pregnancy                             Anesthesia Physical Anesthesia Plan  ASA: II  Anesthesia Plan: Spinal   Post-op Pain Management:    Induction: Intravenous  PONV Risk Score and Plan: 3 and Ondansetron, Dexamethasone, Propofol, Midazolam, Treatment may vary due to age or medical condition and Scopolamine patch - Pre-op  Airway Management Planned: Simple Face Mask  Additional Equipment:   Intra-op Plan:   Post-operative Plan:   Informed Consent: I have reviewed the patients History and Physical, chart, labs and discussed the procedure including the risks, benefits and alternatives for the proposed anesthesia with the patient or authorized representative who has indicated his/her understanding and acceptance.     Plan Discussed with: CRNA, Anesthesiologist and Surgeon  Anesthesia Plan Comments:         Anesthesia Quick Evaluation

## 2017-05-14 NOTE — Anesthesia Procedure Notes (Signed)
Spinal  Patient location during procedure: OR Start time: 05/14/2017 12:42 PM End time: 05/14/2017 12:46 PM Staffing Anesthesiologist: Chaney MallingHODIERNE, Clifford Coudriet Performed: anesthesiologist  Preanesthetic Checklist Completed: patient identified, surgical consent, pre-op evaluation, timeout performed, IV checked, risks and benefits discussed and monitors and equipment checked Spinal Block Patient position: sitting Prep: DuraPrep Patient monitoring: cardiac monitor, continuous pulse ox and blood pressure Approach: midline Location: L3-4 Injection technique: single-shot Needle Needle type: Pencan  Needle gauge: 24 G Needle length: 9 cm Assessment Sensory level: T10 Additional Notes Functioning IV was confirmed and monitors were applied. Sterile prep and drape, including hand hygiene and sterile gloves were used. The patient was positioned and the spine was prepped. The skin was anesthetized with lidocaine.  Free flow of clear CSF was obtained prior to injecting local anesthetic into the CSF.  The spinal needle aspirated freely following injection.  The needle was carefully withdrawn.  The patient tolerated the procedure well.

## 2017-05-14 NOTE — Interval H&P Note (Signed)
History and Physical Interval Note:  05/14/2017 12:23 PM  Kelsey LevyChristina L Willis  has presented today for surgery, with the diagnosis of O34.21 History of C-Section with cerclage removal  The various methods of treatment have been discussed with the patient and family. After consideration of risks, benefits and other options for treatment, the patient has consented to  Procedure(s) with comments: CESAREAN SECTION (N/A) - Cerclage Removal  Order of patient care Spinal Place patient in stirrups for cerclage removal Take patient out of stirrups and position for c-section NON OB PROCEDURE (N/A) - cerclage removal  as a surgical intervention .  The patient's history has been reviewed, patient examined, no change in status, stable for surgery.  I have reviewed the patient's chart and labs.  Questions were answered to the patient's satisfaction.     Dion BodyVARNADO, Mallorie Norrod

## 2017-05-14 NOTE — Transfer of Care (Signed)
Immediate Anesthesia Transfer of Care Note  Patient: Fidel LevyChristina L Ahart  Procedure(s) Performed: Procedure(s) with comments: CESAREAN SECTION (N/A) - Cerclage Removal  Order of patient care Spinal Place patient in stirrups for cerclage removal Take patient out of stirrups and position for c-section NON OB PROCEDURE (N/A) - cerclage removal   Patient Location: PACU  Anesthesia Type:Spinal  Level of Consciousness: awake and Patient remains intubated per anesthesia plan  Airway & Oxygen Therapy: Patient Spontanous Breathing  Post-op Assessment: Report given to RN  Post vital signs: Reviewed and stable  Last Vitals:  Vitals:   05/14/17 1057  BP: 127/78  Pulse: 88  Resp: 18  Temp: 36.7 C    Last Pain:  Vitals:   05/14/17 1057  TempSrc: Oral      Patients Stated Pain Goal: 4 (05/14/17 1057)  Complications: No apparent anesthesia complications

## 2017-05-15 LAB — CBC
HEMATOCRIT: 31.2 % — AB (ref 36.0–46.0)
HEMOGLOBIN: 10.7 g/dL — AB (ref 12.0–15.0)
MCH: 28 pg (ref 26.0–34.0)
MCHC: 34.3 g/dL (ref 30.0–36.0)
MCV: 81.7 fL (ref 78.0–100.0)
Platelets: 220 10*3/uL (ref 150–400)
RBC: 3.82 MIL/uL — AB (ref 3.87–5.11)
RDW: 13.9 % (ref 11.5–15.5)
WBC: 11.6 10*3/uL — ABNORMAL HIGH (ref 4.0–10.5)

## 2017-05-15 LAB — RPR: RPR: NONREACTIVE

## 2017-05-15 NOTE — Progress Notes (Signed)
Visited with MOB in NICU.  She was in good spirits and reports good support.  Her older child was born at 3525 weeks and she feels grateful that Ayden was 36 weeks at birth.  She did not have any concerns at this time, but is appreciative of the support.  Chaplain Dyanne CarrelKaty Lucendia Leard, Bcc Pager, [phone] 3:18 PM    05/15/17 1500  Clinical Encounter Type  Visited With Patient and family together

## 2017-05-15 NOTE — Op Note (Deleted)
  The note originally documented on this encounter has been moved the the encounter in which it belongs.  

## 2017-05-15 NOTE — Anesthesia Postprocedure Evaluation (Signed)
Anesthesia Post Note  Patient: Kelsey LevyChristina L Lottman  Procedure(s) Performed: Procedure(s) (LRB): CESAREAN SECTION (N/A) NON OB PROCEDURE (N/A)     Patient location during evaluation: Mother Baby Anesthesia Type: Spinal Level of consciousness: awake Pain management: satisfactory to patient Vital Signs Assessment: post-procedure vital signs reviewed and stable Respiratory status: spontaneous breathing Cardiovascular status: stable Anesthetic complications: no    Last Vitals:  Vitals:   05/15/17 0356 05/15/17 0835  BP: 118/63 125/78  Pulse: 63 61  Resp: 17 16  Temp: 37 C 36.8 C    Last Pain:  Vitals:   05/15/17 0835  TempSrc: Oral  PainSc:    Pain Goal: Patients Stated Pain Goal: 4 (05/14/17 1057)               Cephus ShellingBURGER,Tasheka Houseman

## 2017-05-15 NOTE — Op Note (Signed)
Kelsey Willis, Kelsey Willis NO.:  [id number]  MEDICAL RECORD NO.:  [id number]  LOCATION:                                FACILITY:  [facility]  PHYSICIAN:  Pieter Partridge, MD   DATE OF BIRTH:  [birth year]  DATE OF PROCEDURE:  05/14/2017 DATE OF DISCHARGE:                              OPERATIVE REPORT   PREOPERATIVE DIAGNOSES:  Intrauterine pregnancy at 25 and 3/7th weeks, history of previous classical C-section, incompetent cervix, and keloid.  POSTOPERATIVE DIAGNOSES:  Intrauterine pregnancy at 54 and 3/7th weeks, history of previous classical C-section, incompetent cervix and keloid.  PROCEDURE:  Repeat low transverse cesarean section via 2-layer closure, cerclage removal, and scar revision.  SURGEON:  Shela Nevin. Dion Body, MD.  ASSISTANT:  Dr. Myna Hidalgo. Technician is Geophysicist/field seismologist as well.  ANESTHESIA:  Spinal.  ESTIMATED BLOOD LOSS:  800.  URINE:  400 mL out, clear.  DRAINS:  Foley catheter.  LOCAL:  20 mL of Kenalog and Marcaine mix.  SPECIMEN:  Placenta.  DISPOSITION OF SPECIMEN:  To pathology.  PATIENT DISPOSITION:  To PACU, hemodynamically stable.  COMPLICATIONS:  None.  FINDINGS:  Normal uterus, fallopian tubes, and ovary bilaterally of a well-developed and normal lower uterine segment.  Viable female infant in the vertex position.  Copious clear fluid.  PROCEDURE IN DETAIL:  Ms. Tennis was identified in the holding area. She was then taken to the operating room with IV running.  She underwent spinal anesthesia without complication.  She was then placed in the dorsal lithotomy position.  A Graves speculum was inserted into the vagina.  The cervix and cerclage were easily identified.  The loop was clipped at the base and easily removed.  There was a little bleeding, but that was stopped with little pressure from a sponge.  The patient was then placed supine.  She was prepped and draped in a normal sterile fashion.  Foley catheter was inserted.   SCDs were on and operating.  She received Ancef 2 g IV prior to the procedure.  After adequate anesthesia was confirmed, the keloid incision was then outlined with a marker.  Then, the scalpel was used to excise the scar. The Allis clamps were used to grab the edge of the scar and it was transected out of the subcutaneous tissue with the Bovie.  The fascia was then separated with the Bovie down to the fascia and incised at the midline of the fascia and extended laterally.  Of note, the patient had a lot of thick scarring of the subcu to the fascia and the fascia was also very thick.  Kocher clamps were used to grasp the fascia, and the rectus muscles were dissected sharply.  There was a small little window made that I repaired with a 0 Vicryl 1 interrupted suture.  The rectus muscles were fused at the midline.  Two Kocher clamps were used and they were separated with the scalpel.  There were a few bands that were cut with Jen Mow and Bovie as needed.  Peritoneal access was gained by grasping the peritoneum, entered sharply with the Metzenbaum scissors and then stretching.  There was a tight band of the peritoneum on the right that was  transected with the scissors and Bovie.  The abdomen was then palpated.  The Alexis retractor was placed in the abdomen atraumatically.  The lower uterine segment appeared very normal.  It was not thin at all. The bladder flap was developed.  A transverse incision was made with the scalpel and extended laterally with the bandage scissors.  The occiput was brought to the incision and the head was delivered atraumatically. Nose and mouth were suctioned.  There was copious fluid coming from the baby's nose and mouth.  There was a cord around the right hand.  Delayed cord clamping was performed.  Nose and mouth were suctioned.  Cord was clamped x2 and cut.  Placenta was removed easily.  Uterus was cleared of all clots and debris.  Hysterotomy incision was then  closed with 0 Vicryl in a continuous locked fashion.  Second layer of the same suture was used.  There was ecchymotic bruise on the apex of the right side, it was a little diminished, but it was not advancing.  The second layer for imbrication was performed.  Interceed was then applied in an inverted T fashion. Peritoneum was then reapproximated with 2-0 Vicryl in a continuous locked fashion.  The fascia was then reapproximated with 0 Vicryl in a continuous fashion.  Two layers of the subcu space were used to bring off the subcuticular stitch.  4-0 Monocryl was used to reapproximate the skin.  Then, the Kenalog and Marcaine mixture was injected.  Steri- Strips, benzoin, honeycomb, and pressure dressing will be applied.  All instrument, needle, and sponge counts were correct x3.  The patient tolerated the procedure well.  The patient started to have some feeling back on the left side when we were on the fascia.  Nesacaine was poured into the incision and we waited 1 minute and then it was adequate.  She also prior to closure of the peritoneum was coughing and the bowel was completely herniated.  Those were gently placed back into the incision. There was no risk of injury to the bowel because that was not showing at the time.     Pieter Partridge, MD   ______________________________ Pieter Partridge, MD   EBV/MEDQ  D:  05/14/2017  T:  05/15/2017  Job:  [id number]

## 2017-05-15 NOTE — Progress Notes (Signed)
Subjective: Postop Day 1: Cesarean Delivery No complaints.  Pain controlled.  Breast feeding yes. Baby in NICU due to hypoglycemia.  Currently not feeding well.  Pt has ambulated extensively.  Objective: Temp:  [97.6 F (36.4 C)-98.6 F (37 C)] 98.3 F (36.8 C) (07/18 1208) Pulse Rate:  [60-73] 71 (07/18 1208) Resp:  [11-21] 16 (07/18 1208) BP: (110-130)/(63-82) 113/65 (07/18 1208) SpO2:  [94 %-100 %] 100 % (07/18 1208)  Physical Exam: Gen: NAD, Pt sitting upright in chair in NICU. Lochia: Not visualized   Recent Labs  05/13/17 0930 05/15/17 0517  HGB 11.1* 10.7*  HCT 33.0* 31.2*    Assessment/Plan: Status post C-section-doing well postoperatively. Encouraged ambulation in halls TID. Routine post op care. Lactation support. Postpone circumcision until discharge from NICU.    Kelsey Willis, Kelsey Willis 05/15/2017, 1:45 PM

## 2017-05-15 NOTE — Lactation Note (Signed)
This note was copied from a baby's chart. Lactation Consultation Note P2 mom.  BF exp. 3 months.  Delivered first child at [redacted] wks gestation.  Mom is very dedicated to BF.  Is comfortable with pumping and had no questions about pump.  Mom states she knows to pump every 3 hours.  Mom was discouraged because she hadn't see any colostrum.  LC requested to assist with hand expression.  Illustration was shown in book.  Massage and hand expression demonstrated; 1 ml of colostrum collected after massaging breast and hand expressing from both sides.  LC gave mom the colostrum collected in a bullet to take downstairs to NICU.  Mom seems very pleased to see colostrum.  LC provided more bullets to take upstairs to hand express after doing STS or holding infant.  LPTI care sheet was provided and points were discussed about infant care after going home and importance of frequent feeds and waking techniques as well as STS.  Mom was given resource sheet and lactation brochure and LC encouraged her to call out  For future questions, assistance or concerns about pumping.   Patient Name: Boy Reece LevyChristina Wipperfurth MWUXL'KToday's Date: 05/15/2017     Maternal Data    Feeding Feeding Type: Donor Breast Milk Nipple Type: Slow - flow Length of feed: 15 min  LATCH Score/Interventions                      Lactation Tools Discussed/Used     Consult Status      Maryruth HancockKelly Suzanne [facility] Center[facility] 05/15/2017, 7:34 PM

## 2017-05-16 LAB — CBC
HEMATOCRIT: 31.1 % — AB (ref 36.0–46.0)
HEMOGLOBIN: 10.5 g/dL — AB (ref 12.0–15.0)
MCH: 27.9 pg (ref 26.0–34.0)
MCHC: 33.8 g/dL (ref 30.0–36.0)
MCV: 82.7 fL (ref 78.0–100.0)
Platelets: 236 10*3/uL (ref 150–400)
RBC: 3.76 MIL/uL — AB (ref 3.87–5.11)
RDW: 14.2 % (ref 11.5–15.5)
WBC: 9.2 10*3/uL (ref 4.0–10.5)

## 2017-05-16 LAB — COMPREHENSIVE METABOLIC PANEL
ALBUMIN: 2.7 g/dL — AB (ref 3.5–5.0)
ALT: 18 U/L (ref 14–54)
ANION GAP: 6 (ref 5–15)
AST: 23 U/L (ref 15–41)
Alkaline Phosphatase: 72 U/L (ref 38–126)
BUN: 8 mg/dL (ref 6–20)
CO2: 24 mmol/L (ref 22–32)
Calcium: 8.9 mg/dL (ref 8.9–10.3)
Chloride: 106 mmol/L (ref 101–111)
Creatinine, Ser: 0.47 mg/dL (ref 0.44–1.00)
GFR calc Af Amer: >60 mL/min (ref >60)
GFR calc non Af Amer: >60 mL/min (ref >60)
GLUCOSE: 76 mg/dL (ref 65–99)
POTASSIUM: 3.9 mmol/L (ref 3.5–5.1)
SODIUM: 136 mmol/L (ref 135–145)
Total Bilirubin: 0.7 mg/dL (ref 0.3–1.2)
Total Protein: 5.9 g/dL — ABNORMAL LOW (ref 6.5–8.1)

## 2017-05-16 LAB — PROTEIN / CREATININE RATIO, URINE
Creatinine, Urine: 100 mg/dL
PROTEIN CREATININE RATIO: 0.17 mg/mg{creat} — AB (ref 0.00–0.15)
Total Protein, Urine: 17 mg/dL

## 2017-05-16 NOTE — Progress Notes (Signed)
Subjective: Postop Day 2: Cesarean Delivery No complaints.  Pain controlled.  Breast feeding yes. Pt denies headaches or visual changes.  Objective: Temp:  [98.2 F (36.8 C)-99.2 F (37.3 C)] 98.4 F (36.9 C) (07/19 0800) Pulse Rate:  [66-80] 72 (07/19 0800) Resp:  [16-20] 18 (07/19 0800) BP: (110-147)/(68-89) 147/89 (07/19 0800) SpO2:  [99 %-100 %] 100 % (07/19 0800)  Physical Exam: Gen: NAD Lochia: Not visualized Abdomen:  Honeycomb dressing is 75 % soiled with old blood. Ext: +Edema   Recent Labs  05/15/17 0517  HGB 10.7*  HCT 31.2*    Assessment/Plan: Status post C-section-doing well postoperatively. BP elevated x 1.  Preeclampsia labs ordered.  BP q 4 hours. Routine post op care. Lactation support. Postpone circumcision until discharge from NICU.  Anticipate discharge tomorrow.   Dr. Richardson Doppole covering at 7 am.     Geryl RankinsVARNADO, Kelsey Zarr 05/16/2017, 1:51 PM

## 2017-05-16 NOTE — Lactation Note (Signed)
This note was copied from a baby's chart. Lactation Consultation Note  Patient Name: Kelsey Willis ZOXWR'UToday's Date: 05/16/2017 Reason for consult: Follow-up assessment;NICU baby  NICU baby 3049 hours old. Mom reports that she was seeing colostrum, but is not seeing any now and does not feel she is hand expressing correctly. Assisted mom with hand expression and colostrum flowing bilaterally. Enc mom to continue to pump every 2-3 hours for a total of 8-12 times followed by hand expression. Mom reports that baby may be discharged soon, and states that she has a personal DEBP.   Maternal Data    Feeding Feeding Type: Donor Breast Milk Nipple Type: Slow - flow Length of feed: 20 min  LATCH Score/Interventions                      Lactation Tools Discussed/Used Tools: Pump Breast pump type: Double-Electric Breast Pump   Consult Status Consult Status: Follow-up Date: 05/17/17 Follow-up type: In-patient    Sherlyn HayJennifer D Gibson Telleria 05/16/2017, 2:30 PM

## 2017-05-17 LAB — TYPE AND SCREEN
ABO/RH(D): B POS
Antibody Screen: NEGATIVE
UNIT DIVISION: 0
UNIT DIVISION: 0
Unit division: 0
Unit division: 0

## 2017-05-17 LAB — BPAM RBC
Blood Product Expiration Date: 201807282359
Blood Product Expiration Date: 201807282359
Blood Product Expiration Date: 201808072359
Blood Product Expiration Date: 201808072359
ISSUE DATE / TIME: 201807091308
ISSUE DATE / TIME: 201807091308
Unit Type and Rh: 5100
Unit Type and Rh: 5100
Unit Type and Rh: 5100
Unit Type and Rh: 5100

## 2017-05-17 MED ORDER — OXYCODONE-ACETAMINOPHEN 5-325 MG PO TABS: 1.0000 | ORAL_TABLET | ORAL | 0 refills | Status: AC | PRN

## 2017-05-17 MED ORDER — IBUPROFEN 600 MG PO TABS: 600.0000 mg | ORAL_TABLET | Freq: Four times a day (QID) | ORAL | 1 refills | Status: AC | PRN

## 2017-05-17 NOTE — Lactation Note (Signed)
This note was copied from a baby's chart. Lactation Consultation Note  Patient Name: Kelsey Reece LevyChristina Stemmer ZOXWR'UToday's Date: 05/17/2017 Reason for consult: Follow-up assessment;NICU baby  NICU baby 6771 hours old. Mom states that her EBM volumes are starting to increase. Mom given 2-week DEBP rental. Enc mom to use pictures of baby while pumping, and to offer lots of STS and nuzzling/latching at the breast as she and baby are able. Mom aware of pumping rooms in NICU, and aware of OP/BFSG and LC phone line assistance after D/C.   Maternal Data    Feeding Feeding Type: Breast Milk (w/ DBM) Nipple Type: Slow - flow Length of feed: 30 min  LATCH Score/Interventions                      Lactation Tools Discussed/Used Tools: Pump Breast pump type: Double-Electric Breast Pump   Consult Status Consult Status: PRN    Sherlyn HayJennifer D Akashdeep Chuba 05/17/2017, 12:41 PM

## 2017-05-17 NOTE — Progress Notes (Signed)
Subjective: Postpartum Day 3: Cesarean Delivery Patient reports tolerating PO, + flatus and no problems voiding.   Pain is well controlled Objective: Vital signs in last 24 hours: Temp:  [97.9 F (36.6 C)-99 F (37.2 C)] 99 F (37.2 C) (07/20 0813) Pulse Rate:  [68-90] 73 (07/20 0813) Resp:  [18-20] 18 (07/20 0813) BP: (118-145)/(65-81) 145/81 (07/20 0813) SpO2:  [98 %-100 %] 100 % (07/20 0813)  Physical Exam:  General: alert, cooperative and no distress Lochia: appropriate Uterine Fundus: firm Incision: healing well DVT Evaluation: No evidence of DVT seen on physical exam.   Recent Labs  05/15/17 0517 05/16/17 1411  HGB 10.7* 10.5*  HCT 31.2* 31.1*    Assessment/Plan: Status post Cesarean section. Doing well postoperatively.  Discharge home with standard precautions and return to clinic in 1 wk for bp check   Leveda Kendrix J. 05/17/2017, 8:51 AM

## 2017-05-17 NOTE — Progress Notes (Signed)
MOB was referred for history of depression/anxiety. * Referral screened out by Clinical Social Worker because none of the following criteria appear to apply: ~ History of anxiety/depression during this pregnancy, or of post-partum depression. ~ Diagnosis of anxiety and/or depression within last 3 years OR * MOB's symptoms currently being treated with medication and/or therapy. Please contact the Clinical Social Worker if needs arise, or if MOB requests.  Kelsey Willis, MSW, LCSW Clinical Social Work [phone] 

## 2017-06-07 NOTE — Discharge Summary (Signed)
OB Discharge Summary     Patient Name: Kelsey Willis DOB: 1985/11/15 MRN: [id number]  Date of admission: 05/14/2017 Delivering MD: Geryl Rankins   Date of discharge: 06/07/2017  Admitting diagnosis: O34.21 History of C-Section Intrauterine pregnancy: [redacted]w[redacted]d     Secondary diagnosis:  Active Problems:   S/P repeat low transverse C-section  Additional problems: Placental abruption at 23 weeks, h/o classical c-section.     Discharge diagnosis: Early term pregnancy delivered.                                                                                                Post partum procedures:None  Augmentation: N/a  Complications: None  Hospital course:  Sceduled C/S   [name] at [redacted]w[redacted]d was admitted to the hospital 05/14/2017 for scheduled cesarean section with the following indication:Prior Uterine Surgery and H/o classical cesarean section.  Membrane Rupture Time/Date: 1:21 PM ,05/14/2017   Patient delivered a Viable infant.05/14/2017  Details of operation can be found in separate operative note.  Pateint had an uncomplicated postpartum course.  She is ambulating, tolerating a regular diet, passing flatus, and urinating well. Patient is discharged home in stable condition on  06/07/17         Physical exam  Vitals:   05/16/17 2009 05/17/17 0028 05/17/17 0414 05/17/17 0813  BP: 122/73 118/77 121/65 (!) 145/81  Pulse: 74 78 68 73  Resp: 18 20 18 18   Temp: 97.9 F (36.6 C) 98.9 F (37.2 C) 98.7 F (37.1 C) 99 F (37.2 C)  TempSrc: Oral Oral Oral Oral  SpO2: 100% 98% 100% 100%  Weight:      Height:       See Dr. Dawayne Patricia note on day of discharge.  Labs: Lab Results  Component Value Date   WBC 9.2 05/16/2017   HGB 10.5 (L) 05/16/2017   HCT 31.1 (L) 05/16/2017   MCV 82.7 05/16/2017   PLT 236 05/16/2017   CMP Latest Ref Rng & Units 05/16/2017  Glucose 65 - 99 mg/dL 76  BUN 6 - 20 mg/dL 8  Creatinine 7.82 - 9.56 mg/dL 2.13  Sodium 086 - 578 mmol/L 136   Potassium 3.5 - 5.1 mmol/L 3.9  Chloride 101 - 111 mmol/L 106  CO2 22 - 32 mmol/L 24  Calcium 8.9 - 10.3 mg/dL 8.9  Total Protein 6.5 - 8.1 g/dL 5.9(L)  Total Bilirubin 0.3 - 1.2 mg/dL 0.7  Alkaline Phos 38 - 126 U/L 72  AST 15 - 41 U/L 23  ALT 14 - 54 U/L 18    Discharge instruction: per After Visit Summary and "Baby and Me Booklet".  After visit meds:  Allergies as of 05/17/2017      Reactions   Lactose Intolerance (gi) Nausea And Vomiting   Latex Itching      Medication List    STOP taking these medications   acetaminophen 650 MG CR tablet Commonly known as:  TYLENOL 8 HOUR   progesterone 200 MG capsule Commonly known as:  PROMETRIUM     TAKE these medications   calcium carbonate 500 MG chewable  tablet Commonly known as:  TUMS - dosed in mg elemental calcium Chew 2 tablets by mouth 2 (two) times daily as needed for indigestion or heartburn.   ibuprofen 600 MG tablet Commonly known as:  ADVIL,MOTRIN Take 1 tablet (600 mg total) by mouth every 6 (six) hours as needed.   iron polysaccharides 150 MG capsule Commonly known as:  NIFEREX Take 1 capsule (150 mg total) by mouth daily.   oxyCODONE-acetaminophen 5-325 MG tablet Commonly known as:  PERCOCET/ROXICET Take 1-2 tablets by mouth every 4 (four) hours as needed (pain scale 4-7).   prenatal multivitamin Tabs tablet Take 1 tablet by mouth daily at 12 noon.       Diet: Regular  Activity: Advance as tolerated. Pelvic rest for 6 weeks.   Outpatient follow up:2 weeks Follow up Appt:No future appointments. Follow up Visit:No Follow-up on file.  Postpartum contraception: Discussed antepartum  Newborn Data: Live born female  Birth Weight: 6 lb 13.4 oz (3100 g) APGAR: 7, 8  Baby Feeding: Breast Disposition: NICU at time of mom's discharge.  Desired inpatient c-section.   06/07/2017 Geryl Rankins, MD

## 2017-06-25 ENCOUNTER — Other Ambulatory Visit: Payer: Self-pay | Admitting: Obstetrics and Gynecology

## 2017-06-25 ENCOUNTER — Other Ambulatory Visit (HOSPITAL_COMMUNITY)
Admission: RE | Admit: 2017-06-25 | Discharge: 2017-06-25 | Disposition: A | Discharge status: Home / Self Care | Payer: Managed Care, Other (non HMO) | Source: Ambulatory Visit | Attending: Obstetrics and Gynecology | Admitting: Obstetrics and Gynecology

## 2017-06-25 DIAGNOSIS — Z124 Encounter for screening for malignant neoplasm of cervix: Secondary | ICD-10-CM | POA: Insufficient documentation

## 2017-06-26 LAB — CYTOLOGY - PAP
Diagnosis: NEGATIVE
HPV (WINDOPATH): NOT DETECTED

## 2018-02-22 ENCOUNTER — Encounter (HOSPITAL_COMMUNITY): Payer: Self-pay | Admitting: Emergency Medicine

## 2018-02-22 ENCOUNTER — Ambulatory Visit (HOSPITAL_COMMUNITY)
Admission: EM | Admit: 2018-02-22 | Discharge: 2018-02-22 | Disposition: A | Discharge status: Home / Self Care | Payer: Managed Care, Other (non HMO) | Attending: Family Medicine | Admitting: Family Medicine

## 2018-02-22 DIAGNOSIS — J0140 Acute pansinusitis, unspecified: Secondary | ICD-10-CM | POA: Diagnosis not present

## 2018-02-22 MED ORDER — IPRATROPIUM BROMIDE 0.06 % NA SOLN: 2.0000 | Freq: Four times a day (QID) | NASAL | 0 refills | Status: AC

## 2018-02-22 MED ORDER — AMOXICILLIN-POT CLAVULANATE 875-125 MG PO TABS: 1.0000 | ORAL_TABLET | Freq: Two times a day (BID) | ORAL | 0 refills | Status: AC

## 2018-02-22 MED ORDER — PREDNISONE 20 MG PO TABS: 40.0000 mg | ORAL_TABLET | Freq: Every day | ORAL | 0 refills | Status: AC

## 2018-02-22 NOTE — ED Provider Notes (Signed)
[facility]    CSN: [id number] Arrival date & time: 02/22/18  1922     History   Chief Complaint Chief Complaint  Patient presents with  . URI    HPI Kelsey Willis is a 32 y.o. female.   32 year old female comes in for over 7 day history of URI symptoms. She has had rhinorrhea, nasal congestion, sinus pressure.  Denies fever, chills, night sweats.  Has been using Nettie pot, allergy medicine without relief. Never smoker.      Past Medical History:  Diagnosis Date  . Headache    have noticed since pregnancy  . Incompetent cervix   . Keloid     Patient Active Problem List   Diagnosis Date Noted  . S/P repeat low transverse C-section 05/14/2017  . Vaginal bleeding in pregnancy, second trimester 02/10/2017  . H/O preterm delivery, currently pregnant, second trimester 02/10/2017  . Cervical cerclage suture present in second trimester 02/10/2017  . Placental abruption, antepartum, second trimester 02/10/2017  . History of C-section - Classical 02/10/2017    Past Surgical History:  Procedure Laterality Date  . CERVICAL CERCLAGE N/A 12/12/2016   Procedure: CERCLAGE CERVICAL;  Surgeon: Geryl Rankins, MD;  Location: [facility];  Service: Gynecology;  Laterality: N/A;  . CESAREAN SECTION     classical incision  . CESAREAN SECTION N/A 05/14/2017   Procedure: CESAREAN SECTION;  Surgeon: Geryl Rankins, MD;  Location: [facility] B[facility]TH[facility];  Service: Obstetrics;  Laterality: N/A;  Cerclage Removal  Order of patient care Spinal Place patient in stirrups for cerclage removal Take patient out of stirrups and position for c-section  . WISDOM TOOTH EXTRACTION      OB History    Gravida  2   Para  2   Term  0   Preterm  2   AB  0   Living  2     SAB  0   TAB  0   Ectopic  0   Multiple  0   Live Births  2            Home Medications    Prior to Admission medications   Medication Sig Start Date End Date Taking? Authorizing Provider    amoxicillin-clavulanate (AUGMENTIN) 875-125 MG tablet Take 1 tablet by mouth every 12 (twelve) hours. 02/22/18   Cathie Hoops, Amy V, PA-C  calcium carbonate (TUMS - DOSED IN MG ELEMENTAL CALCIUM) 500 MG chewable tablet Chew 2 tablets by mouth 2 (two) times daily as needed for indigestion or heartburn.    [provider]  ibuprofen (ADVIL,MOTRIN) 600 MG tablet Take 1 tablet (600 mg total) by mouth every 6 (six) hours as needed. 05/17/17   Gerald Leitz, MD  ipratropium (ATROVENT) 0.06 % nasal spray Place 2 sprays into both nostrils 4 (four) times daily. 02/22/18   Cathie Hoops, Amy V, PA-C  iron polysaccharides (NIFEREX) 150 MG capsule Take 1 capsule (150 mg total) by mouth daily. Patient not taking: Reported on 04/11/2017 02/27/17   Geryl Rankins, MD  oxyCODONE-acetaminophen (PERCOCET/ROXICET) 5-325 MG tablet Take 1-2 tablets by mouth every 4 (four) hours as needed (pain scale 4-7). 05/17/17   Gerald Leitz, MD  predniSONE (DELTASONE) 20 MG tablet Take 2 tablets (40 mg total) by mouth daily for 5 days. 02/22/18 02/27/18  Belinda Fisher, PA-C  Prenatal Vit-Fe Fumarate-FA (PRENATAL MULTIVITAMIN) TABS tablet Take 1 tablet by mouth daily at 12 noon.    [provider]    Family History Family History  Problem Relation Age of Onset  . Asthma Mother   . Hypertension Mother   . Heart disease Father   . Hypertension Father   . Cancer Maternal Grandmother   . Diabetes Maternal Grandmother   . Heart disease Maternal Grandfather   . Thrombosis Maternal Grandfather   . Diabetes Maternal Grandfather   . Cancer Paternal Grandmother   . Diabetes Paternal Grandmother   . Diabetes Paternal Grandfather   . Heart disease Paternal Grandfather   . Birth defects Son        holes in heart closed  . Cancer Maternal Aunt     Social History Social History   Tobacco Use  . Smoking status: Never Smoker  . Smokeless tobacco: Never Used  Substance Use Topics  . Alcohol use: No    Alcohol/week: 0.0 oz    Comment: none since  pregnancy  . Drug use: No     Allergies   Lactose intolerance (gi) and Latex   Review of Systems Review of Systems  Reason unable to perform ROS: See HPI as above.     Physical Exam Triage Vital Signs ED Triage Vitals [02/22/18 1951]  Enc Vitals Group     BP 120/79     Pulse Rate 90     Resp 18     Temp 98 F (36.7 C)     Temp Source Oral     SpO2 99 %     Weight      Height      Head Circumference      Peak Flow      Pain Score      Pain Loc      Pain Edu?      Excl. in GC?    No data found.  Updated Vital Signs BP 120/79 (BP Location: Left Arm)   Pulse 90   Temp 98 F (36.7 C) (Oral)   Resp 18   SpO2 99%   Physical Exam  Constitutional: She is oriented to person, place, and time. She appears well-developed and well-nourished. No distress.  HENT:  Head: Normocephalic and atraumatic.  Right Ear: Tympanic membrane, external ear and ear canal normal. Tympanic membrane is not erythematous and not bulging.  Left Ear: Tympanic membrane, external ear and ear canal normal. Tympanic membrane is not erythematous and not bulging.  Nose: Mucosal edema and rhinorrhea present. Right sinus exhibits maxillary sinus tenderness and frontal sinus tenderness. Left sinus exhibits maxillary sinus tenderness and frontal sinus tenderness.  Mouth/Throat: Uvula is midline, oropharynx is clear and moist and mucous membranes are normal.  Eyes: Pupils are equal, round, and reactive to light. Conjunctivae are normal.  Neck: Normal range of motion. Neck supple.  Cardiovascular: Normal rate, regular rhythm and normal heart sounds. Exam reveals no gallop and no friction rub.  No murmur heard. Pulmonary/Chest: Effort normal and breath sounds normal. She has no decreased breath sounds. She has no wheezes. She has no rhonchi. She has no rales.  Lymphadenopathy:    She has no cervical adenopathy.  Neurological: She is alert and oriented to person, place, and time.  Skin: Skin is warm and  dry.  Psychiatric: She has a normal mood and affect. Her behavior is normal. Judgment normal.     UC Treatments / Results  Labs (all labs ordered are listed, but only abnormal results are displayed) Labs Reviewed - No data to display  EKG None Radiology No results found.  Procedures Procedures (including critical care time)  Medications  Ordered in UC Medications - No data to display   Initial Impression / Assessment and Plan / UC Course  I have reviewed the triage vital signs and the nursing notes.  Pertinent labs & imaging results that were available during my care of the patient were reviewed by me and considered in my medical decision making (see chart for details).    Augmentin for sinusitis.  Prednisone for sinus pressure.  Other symptomatic treatment discussed.  Push fluids.  Return precautions given.  Patient expresses understanding and agrees to plan.  Final Clinical Impressions(s) / UC Diagnoses   Final diagnoses:  Acute non-recurrent pansinusitis    ED Discharge Orders        Ordered    amoxicillin-clavulanate (AUGMENTIN) 875-125 MG tablet  Every 12 hours     02/22/18 1959    ipratropium (ATROVENT) 0.06 % nasal spray  4 times daily     02/22/18 1959    predniSONE (DELTASONE) 20 MG tablet  Daily     02/22/18 1959        Belinda Fisher, PA-C 02/22/18 2004

## 2018-02-22 NOTE — ED Triage Notes (Signed)
Pt sts URI with sinus pressure

## 2018-02-22 NOTE — Discharge Instructions (Signed)
Start augmentin as directed for sinus infection. Prednisone for sinus pressure. Atrovent for nasal congestion/drainage. You can also use over the counter afrin as needed for nasal congestion, do not use more than 3 days in a row as it can cause rebound reaction. Continue zyrtec and netipot as needed. Keep hydrated, your urine should be clear to pale yellow in color. Tylenol/motrin for fever and pain. Monitor for any worsening of symptoms, chest pain, shortness of breath, wheezing, swelling of the throat, follow up for reevaluation.

## 2018-09-24 ENCOUNTER — Encounter: Payer: Self-pay | Admitting: Advanced Practice Midwife

## 2018-09-24 ENCOUNTER — Ambulatory Visit (INDEPENDENT_AMBULATORY_CARE_PROVIDER_SITE_OTHER): Payer: Managed Care, Other (non HMO) | Admitting: Advanced Practice Midwife

## 2018-09-24 VITALS — BP 123/81 | HR 90 | Ht 67.0 in | Wt 198.8 lb

## 2018-09-24 DIAGNOSIS — Z0000 Encounter for general adult medical examination without abnormal findings: Secondary | ICD-10-CM

## 2018-09-24 DIAGNOSIS — B9689 Other specified bacterial agents as the cause of diseases classified elsewhere: Secondary | ICD-10-CM

## 2018-09-24 DIAGNOSIS — Z975 Presence of (intrauterine) contraceptive device: Secondary | ICD-10-CM | POA: Diagnosis not present

## 2018-09-24 DIAGNOSIS — N760 Acute vaginitis: Secondary | ICD-10-CM

## 2018-09-24 MED ORDER — METRONIDAZOLE 0.75 % VA GEL: 1.0000 | Freq: Every day | VAGINAL | 1 refills | Status: AC

## 2018-09-24 MED ORDER — METRONIDAZOLE 500 MG PO TABS: 500.0000 mg | ORAL_TABLET | Freq: Two times a day (BID) | ORAL | 2 refills | Status: AC

## 2018-09-24 NOTE — Progress Notes (Signed)
Pt is in the office for annual, last pap 06-25-17. Currently has Mirena IUD, inserted 07-2017. Pt declines std testing. Pt reports cloudy discharge, vaginal irritation.

## 2018-09-24 NOTE — Patient Instructions (Signed)
Some natural remedies/prevention to try for bacterial vaginosis: Take a probiotic tablet/capsule every day for at least 1-2 months.   Whenever you have symptoms, use boric acid suppositories vaginally every night for a week.   Do not use scented soaps/perfumes in the vaginal area and wear breathable cotton underwear and do not wear tight restrictive clothing.   Bacterial Vaginosis Bacterial vaginosis is a vaginal infection that occurs when the normal balance of bacteria in the vagina is disrupted. It results from an overgrowth of certain bacteria. This is the most common vaginal infection among women ages 3515-44. Because bacterial vaginosis increases your risk for STIs (sexually transmitted infections), getting treated can help reduce your risk for chlamydia, gonorrhea, herpes, and HIV (human immunodeficiency virus). Treatment is also important for preventing complications in pregnant women, because this condition can cause an early (premature) delivery. What are the causes? This condition is caused by an increase in harmful bacteria that are normally present in small amounts in the vagina. However, the reason that the condition develops is not fully understood. What increases the risk? The following factors may make you more likely to develop this condition:  Having a new sexual partner or multiple sexual partners.  Having unprotected sex.  Douching.  Having an intrauterine device (IUD).  Smoking.  Drug and alcohol abuse.  Taking certain antibiotic medicines.  Being pregnant.  You cannot get bacterial vaginosis from toilet seats, bedding, swimming pools, or contact with objects around you. What are the signs or symptoms? Symptoms of this condition include:  Grey or white vaginal discharge. The discharge can also be watery or foamy.  A fish-like odor with discharge, especially after sexual intercourse or during menstruation.  Itching in and around the vagina.  Burning or pain  with urination.  Some women with bacterial vaginosis have no signs or symptoms. How is this diagnosed? This condition is diagnosed based on:  Your medical history.  A physical exam of the vagina.  Testing a sample of vaginal fluid under a microscope to look for a large amount of bad bacteria or abnormal cells. Your health care provider may use a cotton swab or a small wooden spatula to collect the sample.  How is this treated? This condition is treated with antibiotics. These may be given as a pill, a vaginal cream, or a medicine that is put into the vagina (suppository). If the condition comes back after treatment, a second round of antibiotics may be needed. Follow these instructions at home: Medicines  Take over-the-counter and prescription medicines only as told by your health care provider.  Take or use your antibiotic as told by your health care provider. Do not stop taking or using the antibiotic even if you start to feel better. General instructions  If you have a female sexual partner, tell her that you have a vaginal infection. She should see her health care provider and be treated if she has symptoms. If you have a female sexual partner, he does not need treatment.  During treatment: ? Avoid sexual activity until you finish treatment. ? Do not douche. ? Avoid alcohol as directed by your health care provider. ? Avoid breastfeeding as directed by your health care provider.  Drink enough water and fluids to keep your urine clear or pale yellow.  Keep the area around your vagina and rectum clean. ? Wash the area daily with warm water. ? Wipe yourself from front to back after using the toilet.  Keep all follow-up visits as told by  your health care provider. This is important. How is this prevented?  Do not douche.  Wash the outside of your vagina with warm water only.  Use protection when having sex. This includes latex condoms and dental dams.  Limit how many sexual  partners you have. To help prevent bacterial vaginosis, it is best to have sex with just one partner (monogamous).  Make sure you and your sexual partner are tested for STIs.  Wear cotton or cotton-lined underwear.  Avoid wearing tight pants and pantyhose, especially during summer.  Limit the amount of alcohol that you drink.  Do not use any products that contain nicotine or tobacco, such as cigarettes and e-cigarettes. If you need help quitting, ask your health care provider.  Do not use illegal drugs. Where to find more information:  Centers for Disease Control and Prevention: SolutionApps.co.zawww.cdc.gov/std  American Sexual Health Association (ASHA): www.ashastd.org  U.S. Department of Health and Health and safety inspectorHuman Services, Office on Women's Health: ConventionalMedicines.siwww.womenshealth.gov/ or http://www.anderson-williamson.info/https://www.womenshealth.gov/a-z-topics/bacterial-vaginosis Contact a health care provider if:  Your symptoms do not improve, even after treatment.  You have more discharge or pain when urinating.  You have a fever.  You have pain in your abdomen.  You have pain during sex.  You have vaginal bleeding between periods. Summary  Bacterial vaginosis is a vaginal infection that occurs when the normal balance of bacteria in the vagina is disrupted.  Because bacterial vaginosis increases your risk for STIs (sexually transmitted infections), getting treated can help reduce your risk for chlamydia, gonorrhea, herpes, and HIV (human immunodeficiency virus). Treatment is also important for preventing complications in pregnant women, because the condition can cause an early (premature) delivery.  This condition is treated with antibiotic medicines. These may be given as a pill, a vaginal cream, or a medicine that is put into the vagina (suppository). This information is not intended to replace advice given to you by your health care provider. Make sure you discuss any questions you have with your health care provider. Document Released:  10/15/2005 Document Revised: 02/18/2017 Document Reviewed: 06/30/2016 Elsevier Interactive Patient Education  [employer]2018 Elsevier Inc.

## 2018-09-24 NOTE — Progress Notes (Signed)
Subjective:     Kelsey Willis is a 32 y.o. female here for a routine exam.  Current complaints: recurrent BV, vaginal irritation.  Personal health questionnaire reviewed: yes.   Gynecologic History No LMP recorded. Contraception: IUD Last Pap: 2018. Results were: normal Last mammogram: n/a.   Obstetric History OB History  Gravida Para Term Preterm AB Living  2 2 0 2 0 2  SAB TAB Ectopic Multiple Live Births  0 0 0 0 2    # Outcome Date GA Lbr Len/2nd Weight Sex Delivery Anes PTL Lv  2 Preterm 05/14/17 [id number]  3100 g M CS-LTranv Spinal  LIV  1 Preterm 2015 [id number]  907 g M CS-Classical   LIV     The following portions of the patient's history were reviewed and updated as appropriate: allergies, current medications, past family history, past medical history, past social history, past surgical history and problem list.  Review of Systems Pertinent items noted in HPI and remainder of comprehensive ROS otherwise negative.    Objective:  BP 123/81   Pulse 90   Ht 5\' 7"  (1.702 m)   Wt 90.2 kg   LMP 09/07/2018   BMI 31.14 kg/m    VS reviewed, nursing note reviewed,  Constitutional: well developed, well nourished, no distress HEENT: normocephalic CV: normal rate Pulm/chest wall: normal effort Breast Exam:  right breast normal without mass, skin or nipple changes or axillary nodes, left breast normal without mass, skin or nipple changes or axillary nodes Abdomen: soft Neuro: alert and oriented x 3 Skin: warm, dry Psych: affect normal Pelvic exam: Vaginal cultures collected by blind swab  Assessment/Plan:   1. Well woman exam (no gynecological exam) --Doing well except for vaginal irritation/frequent BV. - Cervicovaginal ancillary only( [place])  2. Bacterial vaginosis --Clinical symptoms c/w BV.  Will treat with Metrogel. Pt unsure about insurance coverage so Flagyl Rx as well.  - metroNIDAZOLE (METROGEL) 0.75 % vaginal gel; Place 1 Applicatorful vaginally at  bedtime. Apply one applicatorful to vagina at bedtime for 5 days  Dispense: 70 g; Refill: 1 - metroNIDAZOLE (FLAGYL) 500 MG tablet; Take 1 tablet (500 mg total) by mouth 2 (two) times daily for 7 days.  Dispense: 14 tablet; Refill: 2  Sharen CounterLisa Leftwich-Kirby, CNM 4:23 PM

## 2018-09-26 LAB — CERVICOVAGINAL ANCILLARY ONLY
BACTERIAL VAGINITIS: NEGATIVE
Candida vaginitis: NEGATIVE

## 2019-03-23 ENCOUNTER — Encounter: Payer: Self-pay | Admitting: Physician Assistant

## 2019-03-23 ENCOUNTER — Telehealth: Payer: Managed Care, Other (non HMO) | Admitting: Physician Assistant

## 2019-03-23 DIAGNOSIS — R509 Fever, unspecified: Secondary | ICD-10-CM

## 2019-03-23 DIAGNOSIS — R519 Headache, unspecified: Secondary | ICD-10-CM

## 2019-03-23 DIAGNOSIS — H5713 Ocular pain, bilateral: Secondary | ICD-10-CM

## 2019-03-23 DIAGNOSIS — R05 Cough: Secondary | ICD-10-CM

## 2019-03-23 DIAGNOSIS — R059 Cough, unspecified: Secondary | ICD-10-CM

## 2019-03-23 NOTE — Progress Notes (Signed)
Based on what you shared with me, I feel your condition warrants further evaluation and I recommend that you be seen for a face to face office visit.  Kelsey Willis,  Kelsey Willis should be evaluated in a face to face visit, as its unclear if your symptoms are related to Covid19 or sinus infection.    NOTE: If you entered your credit card information for this eVisit, you will not be charged. You may see a "hold" on your card for the $35 but that hold will drop off and you will not have a charge processed.  If you are having a true medical emergency please call 911.  If you need an urgent face to face visit, [facility] has four urgent care centers for your convenience.    PLEASE NOTE: THE [facility] LOCATIONS AND URGENT CARE CLINICS DO NOT HAVE THE TESTING FOR CORONAVIRUS COVID19 AVAILABLE.  IF YOU FEEL YOU NEED THIS TEST YOU MUST GO TO A TRIAGE LOCATION AT ONE OF THE HOSPITAL EMERGENCY DEPARTMENTS   [email or web address] to reserve your spot online an avoid wait times  [facility] [address], [address] [place], [place] [address] Modified hours of operation: Monday-Friday, 12 PM to 6 PM  Saturday & Sunday 10 AM to 4 PM *Across the street from [employer]  [employer] (New Address!) [address] [place], [place] [address] *Just off [employer], across the road from [place]* Modified hours of operation: Monday-Friday, 12 PM to 6 PM  Closed Saturday & Sunday  [facility]'s modified hours of operation will be in effect from May 1 until May 31   The following sites will take your insurance:  . [facility]  [phone] Get Driving Directions Find a Provider at this Location  [address] [place], [place] [address] . 10 am to 8 pm Monday-Friday . 12 pm to 8 pm Saturday-Sunday   . [facility] at [facility]  [phone] Get Driving Directions Find a Provider at this Location  [address]  [address], [address] [place], [place] [address] . 8 am to 8 pm Monday-Friday . 9 am to 6 pm Saturday . 11 am to 6 pm Sunday   . [facility] at [facility]  [phone] Get Driving Directions  [address] [place], [place] [address] . 8 am to 8 pm Monday-Friday . 8 am to 4 pm Saturday-Sunday   Your e-visit answers were reviewed by a board certified advanced clinical practitioner to complete your personal care plan.  Thank you for using e-Visits. I have spent 7 min in completion and review of this note- Illa Level [facility]

## 2024-09-14 ENCOUNTER — Other Ambulatory Visit: Payer: Self-pay | Admitting: Sports Medicine

## 2024-09-14 DIAGNOSIS — M5416 Radiculopathy, lumbar region: Secondary | ICD-10-CM
